# Patient Record
Sex: Male | Born: 1960 | Hispanic: No | Marital: Married | Smoking: Never smoker
Health system: Southern US, Community
[De-identification: ages and names within clinical notes are randomized; demographics above are authoritative.]

## PROBLEM LIST (undated history)

## (undated) DIAGNOSIS — K635 Polyp of colon: Secondary | ICD-10-CM

## (undated) DIAGNOSIS — E669 Obesity, unspecified: Secondary | ICD-10-CM

## (undated) DIAGNOSIS — M5030 Other cervical disc degeneration, unspecified cervical region: Secondary | ICD-10-CM

## (undated) DIAGNOSIS — D126 Benign neoplasm of colon, unspecified: Secondary | ICD-10-CM

## (undated) DIAGNOSIS — L309 Dermatitis, unspecified: Secondary | ICD-10-CM

## (undated) DIAGNOSIS — K5792 Diverticulitis of intestine, part unspecified, without perforation or abscess without bleeding: Secondary | ICD-10-CM

## (undated) DIAGNOSIS — E785 Hyperlipidemia, unspecified: Secondary | ICD-10-CM

## (undated) DIAGNOSIS — I10 Essential (primary) hypertension: Secondary | ICD-10-CM

## (undated) DIAGNOSIS — I499 Cardiac arrhythmia, unspecified: Secondary | ICD-10-CM

## (undated) DIAGNOSIS — M47816 Spondylosis without myelopathy or radiculopathy, lumbar region: Secondary | ICD-10-CM

## (undated) DIAGNOSIS — R030 Elevated blood-pressure reading, without diagnosis of hypertension: Secondary | ICD-10-CM

## (undated) DIAGNOSIS — IMO0001 Reserved for inherently not codable concepts without codable children: Secondary | ICD-10-CM

## (undated) DIAGNOSIS — G4730 Sleep apnea, unspecified: Secondary | ICD-10-CM

## (undated) HISTORY — PX: FINGER SURGERY: SHX640

## (undated) HISTORY — DX: Polyp of colon: K63.5

## (undated) HISTORY — DX: Hyperlipidemia, unspecified: E78.5

## (undated) HISTORY — DX: Benign neoplasm of colon, unspecified: D12.6

## (undated) HISTORY — DX: Cardiac arrhythmia, unspecified: I49.9

## (undated) HISTORY — DX: Other cervical disc degeneration, unspecified cervical region: M50.30

## (undated) HISTORY — DX: Essential (primary) hypertension: I10

## (undated) HISTORY — DX: Reserved for inherently not codable concepts without codable children: IMO0001

## (undated) HISTORY — DX: Diverticulitis of intestine, part unspecified, without perforation or abscess without bleeding: K57.92

## (undated) HISTORY — PX: VASECTOMY: SHX75

## (undated) HISTORY — DX: Dermatitis, unspecified: L30.9

## (undated) HISTORY — PX: COLONOSCOPY: SHX174

## (undated) HISTORY — DX: Sleep apnea, unspecified: G47.30

## (undated) HISTORY — DX: Elevated blood-pressure reading, without diagnosis of hypertension: R03.0

## (undated) HISTORY — DX: Spondylosis without myelopathy or radiculopathy, lumbar region: M47.816

## (undated) HISTORY — DX: Obesity, unspecified: E66.9

---

## 1998-01-28 ENCOUNTER — Other Ambulatory Visit: Admission: RE | Admit: 1998-01-28 | Discharge: 1998-01-28 | Payer: Self-pay | Admitting: Urology

## 2001-04-04 ENCOUNTER — Encounter: Admission: RE | Admit: 2001-04-04 | Discharge: 2001-04-04 | Payer: Self-pay | Admitting: Family Medicine

## 2001-04-04 ENCOUNTER — Encounter: Payer: Self-pay | Admitting: Family Medicine

## 2007-10-07 ENCOUNTER — Encounter: Admission: RE | Admit: 2007-10-07 | Discharge: 2007-10-07 | Payer: Self-pay | Admitting: Emergency Medicine

## 2009-11-04 ENCOUNTER — Encounter: Admission: RE | Admit: 2009-11-04 | Discharge: 2009-11-04 | Payer: Self-pay | Admitting: Chiropractic Medicine

## 2011-08-21 ENCOUNTER — Encounter: Payer: Self-pay | Admitting: Gastroenterology

## 2011-12-07 ENCOUNTER — Ambulatory Visit
Admission: RE | Admit: 2011-12-07 | Discharge: 2011-12-07 | Disposition: A | Discharge status: Home / Self Care | Payer: BC Managed Care – PPO | Source: Ambulatory Visit | Attending: Emergency Medicine | Admitting: Emergency Medicine

## 2011-12-07 ENCOUNTER — Other Ambulatory Visit: Payer: Self-pay | Admitting: Emergency Medicine

## 2011-12-07 DIAGNOSIS — R05 Cough: Secondary | ICD-10-CM

## 2013-12-03 ENCOUNTER — Encounter: Payer: Self-pay | Admitting: Gastroenterology

## 2013-12-29 ENCOUNTER — Ambulatory Visit: Payer: BC Managed Care – PPO | Admitting: *Deleted

## 2013-12-29 VITALS — Ht 73.0 in | Wt 294.2 lb

## 2013-12-29 DIAGNOSIS — Z1211 Encounter for screening for malignant neoplasm of colon: Secondary | ICD-10-CM

## 2013-12-29 MED ORDER — MOVIPREP 100 G PO SOLR: 1.0000 | Freq: Once | ORAL | Status: DC

## 2013-12-29 NOTE — Progress Notes (Signed)
No egg or soy allergy. ewm No home 02 use. ewm No diet pills. ewm No problems with past sedation. ewm emmi to pt's e mail. ewm

## 2014-01-05 ENCOUNTER — Encounter: Payer: Self-pay | Admitting: Gastroenterology

## 2014-01-29 ENCOUNTER — Ambulatory Visit (AMBULATORY_SURGERY_CENTER): Payer: BC Managed Care – PPO | Admitting: Gastroenterology

## 2014-01-29 ENCOUNTER — Encounter: Payer: Self-pay | Admitting: Gastroenterology

## 2014-01-29 VITALS — BP 153/102 | HR 80 | Temp 96.8°F | Resp 22 | Ht 73.0 in | Wt 294.0 lb

## 2014-01-29 DIAGNOSIS — D123 Benign neoplasm of transverse colon: Secondary | ICD-10-CM

## 2014-01-29 DIAGNOSIS — Z1211 Encounter for screening for malignant neoplasm of colon: Secondary | ICD-10-CM

## 2014-01-29 MED ORDER — SODIUM CHLORIDE 0.9 % IV SOLN: 500.0000 mL | INTRAVENOUS | Status: DC

## 2014-01-29 NOTE — Progress Notes (Signed)
Room staff made aware of elevated blood  And pt. Has not taken blood pressure medication in two days.

## 2014-01-29 NOTE — Progress Notes (Signed)
Called to room to assist during endoscopic procedure.  Patient ID and intended procedure confirmed with present staff. Received instructions for my participation in the procedure from the performing physician.  

## 2014-01-29 NOTE — Patient Instructions (Addendum)
YOU HAD AN ENDOSCOPIC PROCEDURE TODAY AT THE [facility]: Refer to the procedure report that was given to you for any specific questions about what was found during the examination.  If the procedure report does not answer your questions, please call your gastroenterologist to clarify.  If you requested that your care partner not be given the details of your procedure findings, then the procedure report has been included in a sealed envelope for you to review at your convenience later.  YOU SHOULD EXPECT: Some feelings of bloating in the abdomen. Passage of more gas than usual.  Walking can help get rid of the air that was put into your GI tract during the procedure and reduce the bloating. If you had a lower endoscopy (such as a colonoscopy or flexible sigmoidoscopy) you may notice spotting of blood in your stool or on the toilet paper. If you underwent a bowel prep for your procedure, then you may not have a normal bowel movement for a few days.  DIET: Your first meal following the procedure should be a light meal and then it is ok to progress to your normal diet.  A half-sandwich or bowl of soup is an example of a good first meal.  Heavy or fried foods are harder to digest and may make you feel nauseous or bloated.  Likewise meals heavy in dairy and vegetables can cause extra gas to form and this can also increase the bloating.  Drink plenty of fluids but you should avoid alcoholic beverages for 24 hours.  ACTIVITY: Your care partner should take you home directly after the procedure.  You should plan to take it easy, moving slowly for the rest of the day.  You can resume normal activity the day after the procedure however you should NOT DRIVE or use heavy machinery for 24 hours (because of the sedation medicines used during the test).    SYMPTOMS TO REPORT IMMEDIATELY: A gastroenterologist can be reached at any hour.  During normal business hours, 8:30 AM to 5:00 PM Monday through Friday,  call [phone].  After hours and on weekends, please call the GI answering service at [phone] who will take a message and have the physician on call contact you.   Following lower endoscopy (colonoscopy or flexible sigmoidoscopy):  Excessive amounts of blood in the stool  Significant tenderness or worsening of abdominal pains  Swelling of the abdomen that is new, acute  Fever of 100F or higher  FOLLOW UP: If any biopsies were taken you will be contacted by phone or by letter within the next 1-3 weeks.  Call your gastroenterologist if you have not heard about the biopsies in 3 weeks.  Our staff will call the home number listed on your records the next business day following your procedure to check on you and address any questions or concerns that you may have at that time regarding the information given to you following your procedure. This is a courtesy call and so if there is no answer at the home number and we have not heard from you through the emergency physician on call, we will assume that you have returned to your regular daily activities without incident.  SIGNATURES/CONFIDENTIALITY: You and/or your care partner have signed paperwork which will be entered into your electronic medical record.  These signatures attest to the fact that that the information above on your After Visit Summary has been reviewed and is understood.  Full responsibility of the confidentiality of this   discharge information lies with you and/or your care-partner.  Recommendations Next colonoscopy determined by pathology results; 5 years or 10 years. Polyp(s), diverticulosis, high fiber diet, and hemorrhoid handouts provided to patient/care partner. Hold all aspirin, aspirin products, and NSAIDS for 2 weeks.

## 2014-01-29 NOTE — Op Note (Signed)
[facility]  [address] [place], [address]   COLONOSCOPY PROCEDURE REPORT  PATIENT: Mitchell Sosa, Mitchell Sosa  MR#: [id number] BIRTHDATE: September 23, 1960 , 53  yrs. old GENDER: male ENDOSCOPIST: Ladene Artist, MD, [facility] REFERRED NI:OEVOJ, Robert PROCEDURE DATE:  01/29/2014 PROCEDURE:   Colonoscopy with snare polypectomy First Screening Colonoscopy - Avg.  risk and is 50 yrs.  old or older Yes.  Prior Negative Screening - Now for repeat screening. N/A  History of Adenoma - Now for follow-up colonoscopy & has been > or = to 3 yrs.  N/A  Polyps Removed Today? Yes. ASA CLASS:   Class II INDICATIONS:average risk for colorectal cancer. MEDICATIONS: Monitored anesthesia care and Propofol 400 mg IV DESCRIPTION OF PROCEDURE:   After the risks benefits and alternatives of the procedure were thoroughly explained, informed consent was obtained.  The digital rectal exam revealed no abnormalities of the rectum.   The LB [id number]  endoscope was introduced through the anus and advanced to the cecum, which was identified by both the appendix and ileocecal valve. No adverse events experienced.   The quality of the prep was excellent, using MoviPrep  The instrument was then slowly withdrawn as the colon was fully examined.  COLON FINDINGS: A sessile polyp measuring 10 mm in size was found at the hepatic flexure.  A polypectomy was performed using snare cautery.  The resection was complete, the polyp tissue was completely retrieved and sent to histology.   There was diverticulosis noted in the sigmoid colon.   The examination was otherwise normal.  Retroflexed views revealed internal Grade I hemorrhoids. The time to cecum=3 minutes 38 seconds.  Withdrawal time=14 minutes 31 seconds.  The scope was withdrawn and the procedure completed. COMPLICATIONS: There were no immediate complications.  ENDOSCOPIC IMPRESSION: 1.   Sessile polyp at the hepatic flexure; polypectomy  performed using snare cautery 2.   Diverticulosis was noted in the sigmoid colon 3.   Grade l internal hemorrhoids  RECOMMENDATIONS: 1.  Await pathology results 2.  Repeat colonoscopy in 5 years if polyp adenomatous; otherwise 10 years 3.  High fiber diet with liberal fluid intake.  eSigned:  Ladene Artist, MD, [facility] 01/29/2014 2:33 PM

## 2014-01-29 NOTE — Progress Notes (Signed)
Patient awakening,vss,report to rn 

## 2014-02-01 ENCOUNTER — Telehealth: Payer: Self-pay | Admitting: *Deleted

## 2014-02-01 NOTE — Telephone Encounter (Signed)
  Follow up Call-  Call back number 01/29/2014  Post procedure Call Back phone  # [phone]  Permission to leave phone message Yes    [facility]

## 2014-02-02 ENCOUNTER — Encounter: Payer: BC Managed Care – PPO | Admitting: Gastroenterology

## 2014-02-02 ENCOUNTER — Encounter: Payer: Self-pay | Admitting: Gastroenterology

## 2016-01-03 ENCOUNTER — Other Ambulatory Visit (HOSPITAL_COMMUNITY): Payer: Self-pay | Admitting: Family Medicine

## 2016-01-03 DIAGNOSIS — M25562 Pain in left knee: Secondary | ICD-10-CM

## 2016-01-05 ENCOUNTER — Other Ambulatory Visit (HOSPITAL_COMMUNITY): Payer: Self-pay | Admitting: Family Medicine

## 2016-01-05 DIAGNOSIS — M25562 Pain in left knee: Secondary | ICD-10-CM

## 2016-01-17 ENCOUNTER — Ambulatory Visit
Admission: RE | Admit: 2016-01-17 | Discharge: 2016-01-17 | Disposition: A | Discharge status: Home / Self Care | Payer: BLUE CROSS/BLUE SHIELD | Source: Ambulatory Visit | Attending: Family Medicine | Admitting: Family Medicine

## 2016-01-17 DIAGNOSIS — M25562 Pain in left knee: Secondary | ICD-10-CM | POA: Diagnosis not present

## 2016-01-30 ENCOUNTER — Ambulatory Visit (INDEPENDENT_AMBULATORY_CARE_PROVIDER_SITE_OTHER): Payer: BLUE CROSS/BLUE SHIELD | Admitting: Orthopaedic Surgery

## 2016-01-30 DIAGNOSIS — M93262 Osteochondritis dissecans, left knee: Secondary | ICD-10-CM | POA: Diagnosis not present

## 2016-01-30 DIAGNOSIS — M25562 Pain in left knee: Secondary | ICD-10-CM

## 2016-01-30 NOTE — Progress Notes (Signed)
Office Visit Note   Patient: Mitchell Sosa           Date of Birth: 1960-11-09           MRN: [id number] Visit Date: 01/30/2016              Requested by: Josetta Huddle, MD [address] [place], [place] [address] PCP: Henrine Screws, MD   Assessment & Plan: Visit Diagnoses:  1. 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 He has significant edema in the bone and joint effusion. This is from an acute injury. I showed him his MRI and with the knee modeling spent in detail what surgery involves including the risk and benefits of this. Given the amount of pain and swelling is having and the fact this is had on his mobility is activity is daily living he does wish to even arthroscopic intervention. We would then see him back in one week postoperative for suture removal.  Follow-Up Instructions: No Follow-up on file.   Orders:  No orders of the defined types were placed in this encounter.  No orders of the defined types were placed in this encounter.     Procedures: No procedures performed   Clinical Data: No additional findings.   Subjective: Chief Complaint  Patient presents with  . Left Knee - Pain    Patient has had some knee pain for a couple months. No new actual injury [place]". Patient states he did injure his knee as a teen in football. Had MRI recently from his PCP    HPI He complains of pain with weightbearing and a lot of locking catching in his knee as well as clicking. He is also had intermittent swelling of the left knee and it swells quite a bit toward the end of the day. His pain can be 1010 with activities. Review of Systems Negative for headache, chest pain, shortness breath, fever, chills, nausea, vomiting.  Objective: Vital Signs: There were no vitals taken for this  visit.  Physical Exam He is alert and oriented 3 in no acute distress robs discomfort Ortho Exam Examination of his left knee shows a mild effusion. He has significant crepitation of patellofemoral joint that he does not have on the right side. He has lateral joint line tenderness as well but most of his pain was with manipulating the patella at the trochlear groove. Specialty Comments:  No specialty comments available.  Imaging: No results found. I reviewed his MRI with him in detail 1 over a knee model showing him where the osteochondral lesion is at the trochlar groove. There significant surrounding edema in the lateral femoral condyle and around the trochlear groove to the lateral side where this OCD lesion is   PMstory: There are no active problems to display for this patient.  Past Medical History:  Diagnosis Date  . Degenerative disc disease, cervical    Cervical degenerative disease with radiculopathy  . Elevated blood pressure    without diagnosis of hypertension  . Hyperlipidemia   . Hypertension   . Lumbar spondylosis    multilevel lumbar spondylosis, and probably L2-3 spinal stenosis - Dr. Hazle Coca  . Obesity   . Sleep apnea    has cpap-doesnt use it much    Family History  Problem Relation Age of Onset  .  Dementia Mother   . Heart disease Father   . Stroke Father   . Colon cancer Neg Hx   . Rectal cancer Neg Hx   . Stomach cancer Neg Hx     Past Surgical History:  Procedure Laterality Date  . FINGER SURGERY     right fifth finger secondary to football injury   . VASECTOMY     Social History   Occupational History  . Not on file.   Social History Main Topics  . Smoking status: Never Smoker  . Smokeless tobacco: Never Used  . Alcohol use 0.0 oz/week     Comment: occasionally-rare  . Drug use: No  . Sexual activity: Not on file

## 2016-02-14 DIAGNOSIS — M93262 Osteochondritis dissecans, left knee: Secondary | ICD-10-CM | POA: Diagnosis not present

## 2016-02-15 DIAGNOSIS — M1712 Unilateral primary osteoarthritis, left knee: Secondary | ICD-10-CM | POA: Diagnosis not present

## 2016-02-15 DIAGNOSIS — M25562 Pain in left knee: Secondary | ICD-10-CM | POA: Diagnosis not present

## 2016-02-16 ENCOUNTER — Inpatient Hospital Stay (INDEPENDENT_AMBULATORY_CARE_PROVIDER_SITE_OTHER): Payer: Self-pay | Admitting: Physician Assistant

## 2016-06-01 DIAGNOSIS — E782 Mixed hyperlipidemia: Secondary | ICD-10-CM | POA: Diagnosis not present

## 2016-06-01 DIAGNOSIS — L820 Inflamed seborrheic keratosis: Secondary | ICD-10-CM | POA: Diagnosis not present

## 2016-06-01 DIAGNOSIS — E669 Obesity, unspecified: Secondary | ICD-10-CM | POA: Diagnosis not present

## 2016-06-01 DIAGNOSIS — E559 Vitamin D deficiency, unspecified: Secondary | ICD-10-CM | POA: Diagnosis not present

## 2016-06-01 DIAGNOSIS — I10 Essential (primary) hypertension: Secondary | ICD-10-CM | POA: Diagnosis not present

## 2016-06-06 DIAGNOSIS — Z205 Contact with and (suspected) exposure to viral hepatitis: Secondary | ICD-10-CM | POA: Diagnosis not present

## 2017-10-29 DIAGNOSIS — H35033 Hypertensive retinopathy, bilateral: Secondary | ICD-10-CM | POA: Diagnosis not present

## 2017-11-10 DIAGNOSIS — S51812A Laceration without foreign body of left forearm, initial encounter: Secondary | ICD-10-CM | POA: Diagnosis not present

## 2017-11-10 DIAGNOSIS — W1789XA Other fall from one level to another, initial encounter: Secondary | ICD-10-CM | POA: Diagnosis not present

## 2017-11-10 DIAGNOSIS — S4992XA Unspecified injury of left shoulder and upper arm, initial encounter: Secondary | ICD-10-CM | POA: Diagnosis not present

## 2018-03-10 ENCOUNTER — Other Ambulatory Visit (HOSPITAL_BASED_OUTPATIENT_CLINIC_OR_DEPARTMENT_OTHER): Payer: Self-pay

## 2018-03-10 DIAGNOSIS — R5383 Other fatigue: Secondary | ICD-10-CM

## 2018-03-10 DIAGNOSIS — R0683 Snoring: Secondary | ICD-10-CM

## 2018-03-10 DIAGNOSIS — G4730 Sleep apnea, unspecified: Secondary | ICD-10-CM

## 2018-03-10 DIAGNOSIS — G4710 Hypersomnia, unspecified: Secondary | ICD-10-CM

## 2018-03-17 ENCOUNTER — Ambulatory Visit (HOSPITAL_BASED_OUTPATIENT_CLINIC_OR_DEPARTMENT_OTHER): Payer: BLUE CROSS/BLUE SHIELD | Attending: Family Medicine | Admitting: Internal Medicine

## 2018-03-17 VITALS — Ht 73.0 in | Wt 300.0 lb

## 2018-03-17 DIAGNOSIS — G4730 Sleep apnea, unspecified: Secondary | ICD-10-CM | POA: Diagnosis not present

## 2018-03-17 DIAGNOSIS — G4752 REM sleep behavior disorder: Secondary | ICD-10-CM | POA: Insufficient documentation

## 2018-03-17 DIAGNOSIS — R5383 Other fatigue: Secondary | ICD-10-CM | POA: Diagnosis not present

## 2018-03-17 DIAGNOSIS — G4710 Hypersomnia, unspecified: Secondary | ICD-10-CM | POA: Diagnosis not present

## 2018-03-17 DIAGNOSIS — R0683 Snoring: Secondary | ICD-10-CM | POA: Diagnosis not present

## 2018-03-17 DIAGNOSIS — R0681 Apnea, not elsewhere classified: Secondary | ICD-10-CM

## 2018-03-22 DIAGNOSIS — R0683 Snoring: Secondary | ICD-10-CM | POA: Diagnosis not present

## 2018-03-22 NOTE — Procedures (Signed)
Patient Name: Mitchell, Sosa Date: 03/17/2018 Gender: Male D.O.B: 1960-08-03 Age (years): 57 Referring Provider: Waldemar Dickens Height (inches): 48 Interpreting Physician: Baird Lyons MD, ABS[facility] Weight (lbs): 300 RPSGT: Laren Everts BMI: 40 MRN: [id number] Neck Size: 20.00  CLINICAL INFORMATION Sleep Study Type: Split Night CPAP Indication for sleep study: Excessive Daytime Sleepiness, Fatigue, Hypertension, Obesity, Snoring, Witnessed Apneas Epworth Sleepiness Score: 5  SLEEP STUDY TECHNIQUE As per the AASM Manual for the Scoring of Sleep and Associated Events v2.3 (April 2016) with a hypopnea requiring 4% desaturations.  The channels recorded and monitored were frontal, central and occipital EEG, electrooculogram (EOG), submentalis EMG (chin), nasal and oral airflow, thoracic and abdominal wall motion, anterior tibialis EMG, snore microphone, electrocardiogram, and pulse oximetry. Continuous positive airway pressure (CPAP) was initiated when the patient met split night criteria and was titrated according to treat sleep-disordered breathing.  MEDICATIONS Medications self-administered by patient taken the night of the study : AMBIEN  RESPIRATORY PARAMETERS Diagnostic  Total AHI (/hr): 113.8 RDI (/hr): 115.6 OA Index (/hr): 30.4 CA Index (/hr): 0.0 REM AHI (/hr): 97.5 NREM AHI (/hr): 114.8 Supine AHI (/hr): 113.8 Non-supine AHI (/hr): 0 Min O2 Sat (%): 65.0 Mean O2 (%): 87.9 Time below 88% (min): 65.2   Titration  Optimal Pressure (cm): 17 AHI at Optimal Pressure (/hr): 0.0 Min O2 at Optimal Pressure (%): 94.0 Supine % at Optimal (%): 100 Sleep % at Optimal (%): 100   SLEEP ARCHITECTURE The recording time for the entire night was 412.6 minutes.  During a baseline period of 158.1 minutes, the patient slept for 136.0 minutes in REM and nonREM, yielding a sleep efficiency of 86.0%%. Sleep onset after lights out was 15.6 minutes with a REM latency of 74.0  minutes. The patient spent 7.4%% of the night in stage N1 sleep, 86.8%% in stage N2 sleep, 0.0%% in stage N3 and 5.9% in REM.  During the titration period of 246.0 minutes, the patient slept for 229.0 minutes in REM and nonREM, yielding a sleep efficiency of 93.1%%. Sleep onset after CPAP initiation was 3.9 minutes with a REM latency of 26.5 minutes. The patient spent 7.9%% of the night in stage N1 sleep, 63.5%% in stage N2 sleep, 0.0%% in stage N3 and 28.6% in REM.  CARDIAC DATA The 2 lead EKG demonstrated sinus rhythm. The mean heart rate was 100.0 beats per minute. Other EKG findings include: PVCs.  LEG MOVEMENT DATA The total Periodic Limb Movements of Sleep (PLMS) were 0. The PLMS index was 0.0 .  IMPRESSIONS - Severe obstructive sleep apnea occurred during the diagnostic portion of the study (AHI = 113.8/hour). An optimal PAP pressure was selected for this patient ( 17 cm of water) - No significant central sleep apnea occurred during the diagnostic portion of the study (CAI = 0.0/hour). - Moderate oxygen desaturation was noted during the diagnostic portion of the study (Min O2 =65.0%). Min sat at CPAP 17 was 94%. - The patient snored with loud snoring volume during the diagnostic portion of the study. - EKG findings include PVCs. - Clinically significant periodic limb movements did not occur during sleep.  DIAGNOSIS - Obstructive Sleep Apnea (327.23 [G47.33 ICD-10])  RECOMMENDATIONS - Trial of CPAP therapy on 17 cm H2O, Or autopap 10-20. - Patient used a Large size Philips Respironics Full Face Mask Dreamwear mask and heated humidification. - Be careful with alcohol, sedatives and other CNS depressants that may worsen sleep apnea and disrupt normal sleep architecture. -  Sleep hygiene should be reviewed to assess factors that may improve sleep quality. - Weight management and regular exercise should be initiated or continued.  [Electronically signed] 03/22/2018 03:05 PM  Baird Lyons MD, [place], American Board of Sleep Medicine   NPI: [id number]                      [place], [place] [facility]

## 2018-03-28 IMAGING — MR MR KNEE*L* W/O CM
4 of 5 series · 31 of 40 positions shown · non-contrast
Comparison: None.

CLINICAL DATA: Left knee pain and swelling. Increased pain with
flexing.

EXAM:
MRI OF THE LEFT KNEE WITHOUT CONTRAST
TECHNIQUE: Multiplanar, multisequence MR imaging of the knee was performed. No
intravenous contrast was administered.

[Series 6: PD fat-sat · axial · left · 3.0mm · 0.44mm/px · z∈[-38,+87]mm · 9 of 39 slices shown (1 of 3)]
[im 1/39]
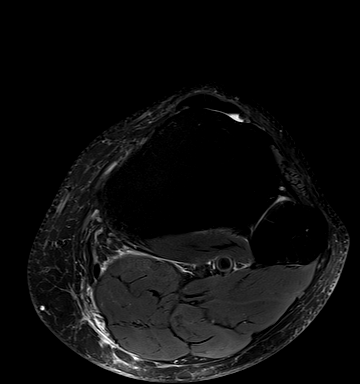
[im 5/39]
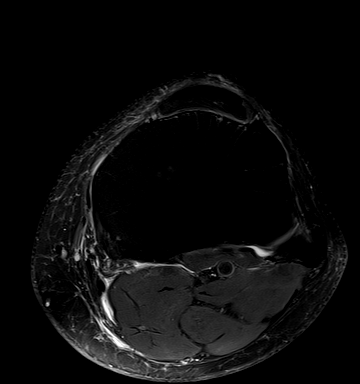
[im 10/39]
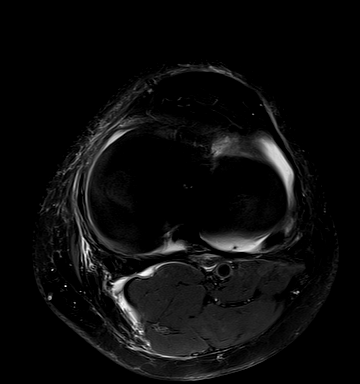
[im 15/39]
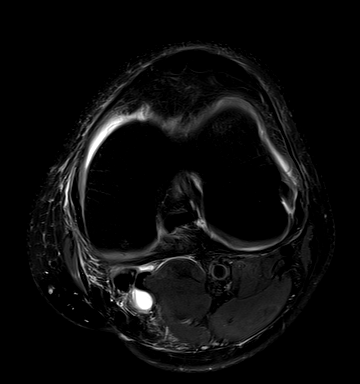
[im 20/39]
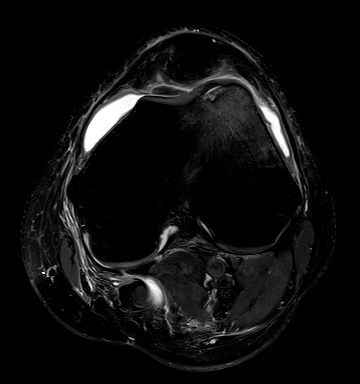
[im 24/39]
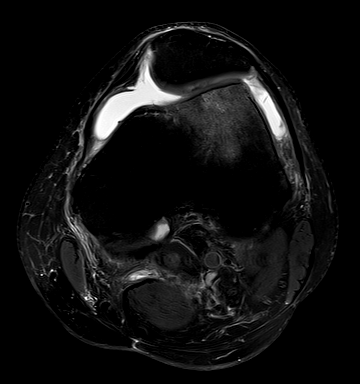
[im 29/39]
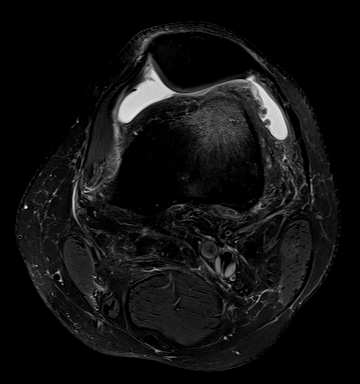
[im 34/39]
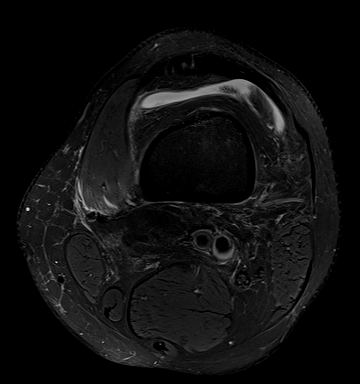
[im 39/39]
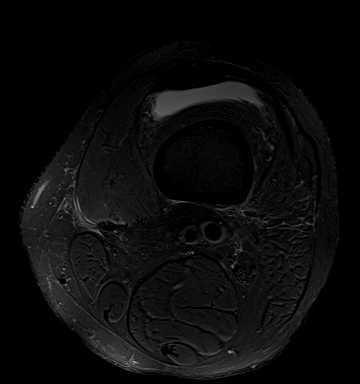

[Series 8: PD fat-sat · coronal · left · 3.0mm · 0.33mm/px · 8 of 39 slices shown (2 of 3)]
[im 1/39]
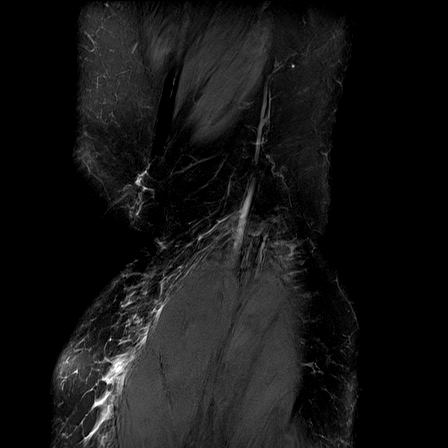
[im 6/39]
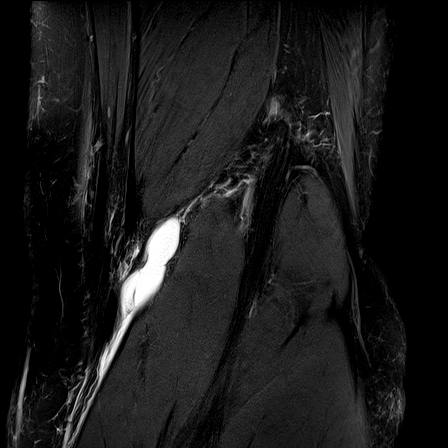
[im 11/39]
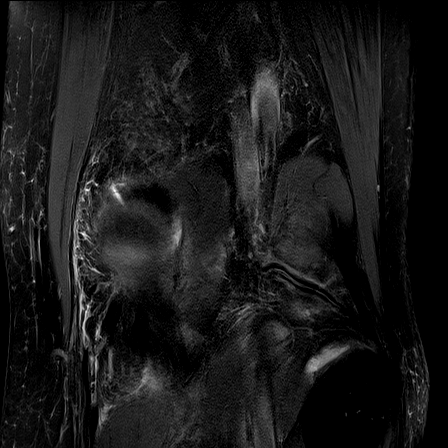
[im 17/39]
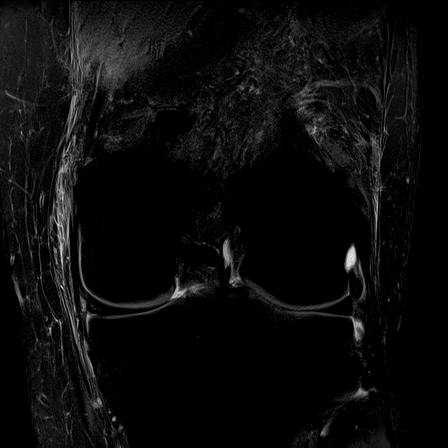
[im 22/39]
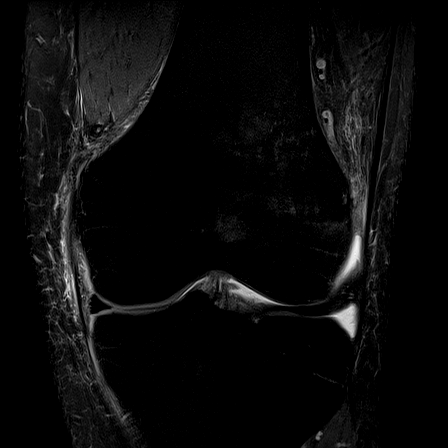
[im 28/39]
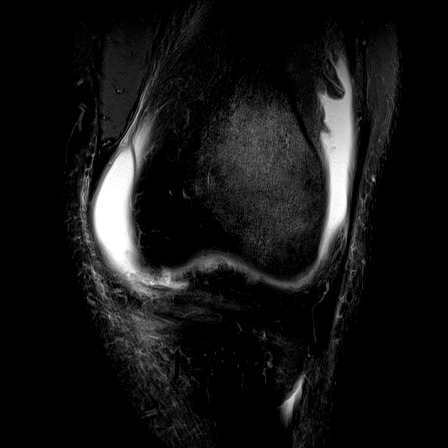
[im 33/39]
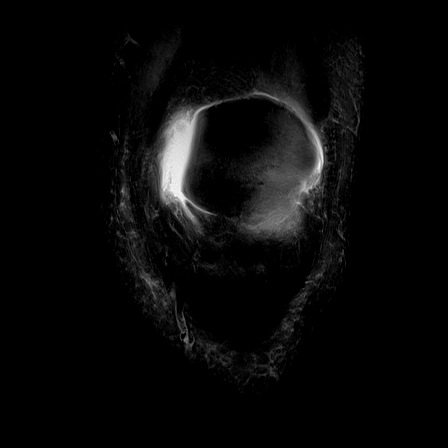
[im 39/39]
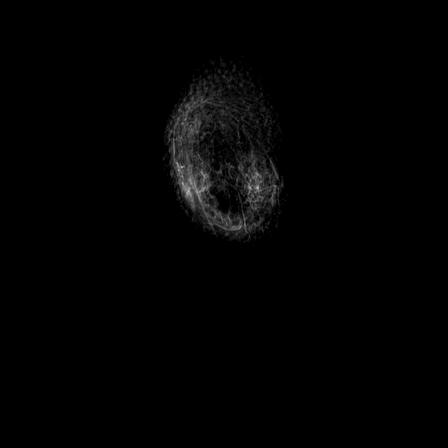

[Series 9: PD fat-sat · sagittal · left · 3.0mm · 0.39mm/px · 7 of 33 slices shown (3 of 3)]
[im 1/33]
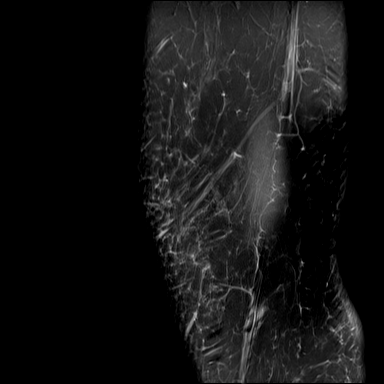
[im 6/33]
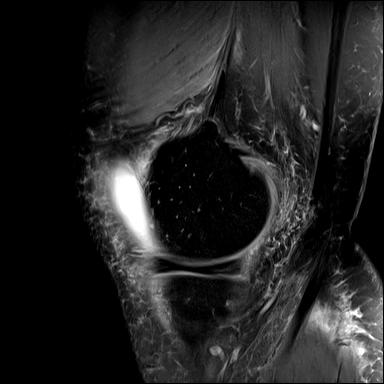
[im 11/33]
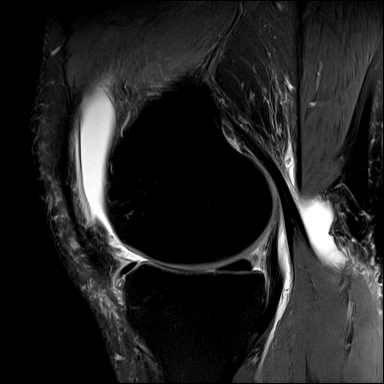
[im 17/33]
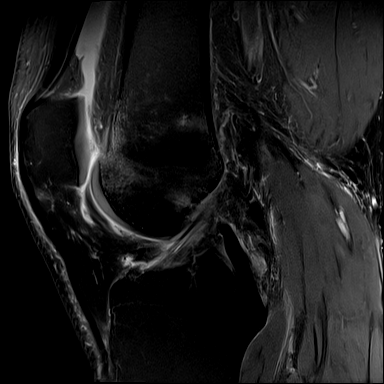
[im 22/33]
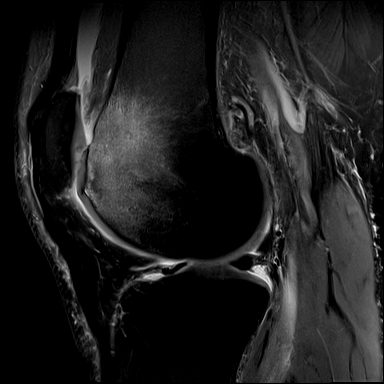
[im 27/33]
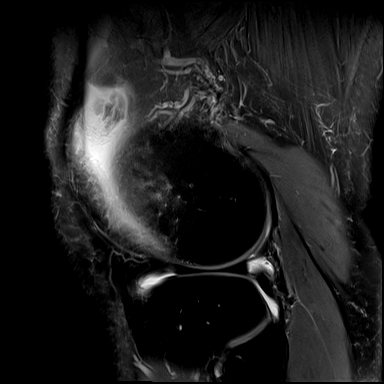
[im 33/33]
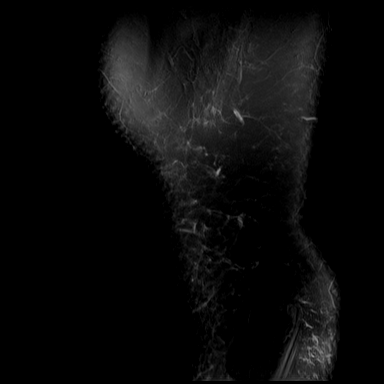

[Series 10: T2 fat-sat · coronal · left · 3.0mm · 0.39mm/px · 7 of 39 slices shown]
[im 1/39]
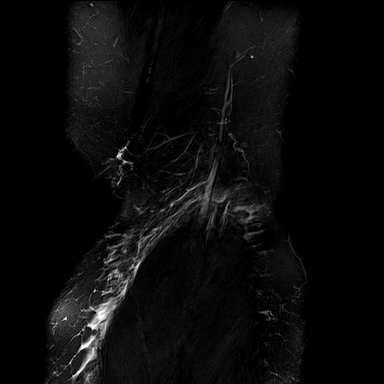
[im 6/39]
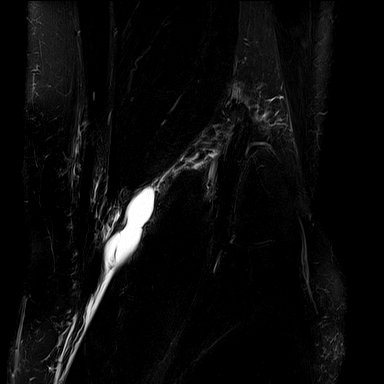
[im 11/39]
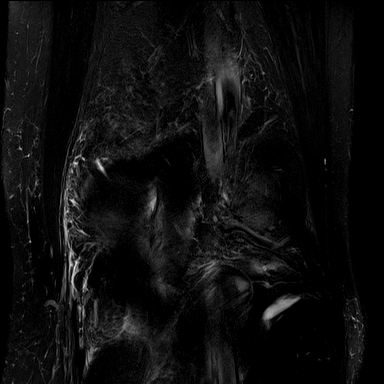
[im 17/39]
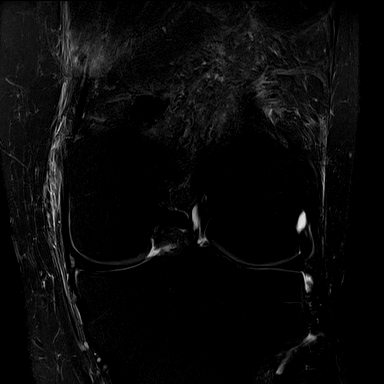
[im 22/39]
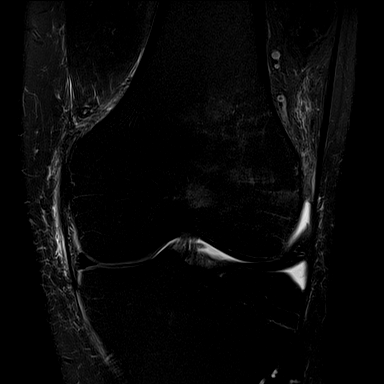
[im 28/39]
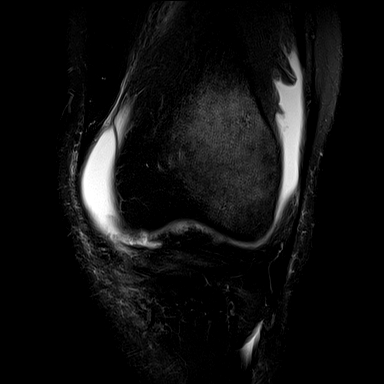
[im 33/39]
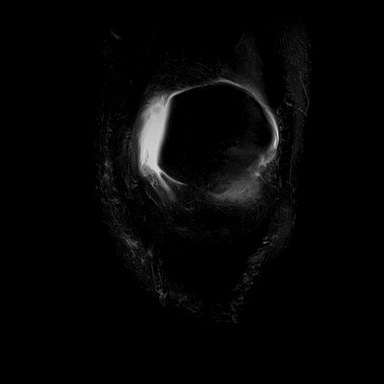

[31 of 40 positions shown; findings below may reference images not displayed]

FINDINGS: MENISCI

Medial meniscus:  Intact.

Lateral meniscus:  Intact.

LIGAMENTS

Cruciates:  Intact ACL and PCL.

Collaterals: Mild edema superficial to the MCL with the MCL
otherwise intact most consistent with grade 1 MCL injury. Lateral
collateral ligament complex is intact.

CARTILAGE

Patellofemoral: Mild partial-thickness cartilage loss of the lateral
patellofemoral compartment. Osteochondral lesion involving the
lateral trochlea measuring 11 x 17 mm with subcortical cystic
changes and a small area of fluid undercutting the cartilage without
overlying cartilage loss. Severe marrow edema in the bone deep to
the chondral surface.

Medial:  No chondral defect.

Lateral:  No chondral defect.

Joint: Large joint effusion. Normal Hoffa's fat. No plical
thickening.

Popliteal Fossa: Small Baker cyst. Small amount of fluid superficial
to the medial gastrocnemius muscle most consistent with a leaking
Baker cyst. Intact popliteus tendon.

Extensor Mechanism: Intact quadriceps tendon and patellar tendon.
Intact lateral patellar retinaculum. Mild increased signal in the
medial patellar retinaculum likely reflecting injury without
disruption.

Bones:  No evidence of fracture or dislocation.

Other: No fluid collection or hematoma.
IMPRESSION: 1. Osteochondral lesion involving the lateral trochlea measuring 11
x 17 mm with subcortical cystic changes and a small area of fluid
undercutting the cartilage without overlying cartilage loss. Severe
marrow edema in the bone deep to the chondral surface. Alternatively
this may reflect an osteochondral impaction injury secondary to
direct trauma versus a remote transient lateral patellar dislocation
(Although no associated marrow contusion is identified in the medial
patella).
2. Mild partial-thickness cartilage loss of the lateral
patellofemoral compartment.
3. Grade 1 MCL strain.
4. Large joint effusion.
5. Small leaking Baker cyst.

## 2018-04-18 DIAGNOSIS — G4733 Obstructive sleep apnea (adult) (pediatric): Secondary | ICD-10-CM | POA: Diagnosis not present

## 2018-05-17 DIAGNOSIS — G4733 Obstructive sleep apnea (adult) (pediatric): Secondary | ICD-10-CM | POA: Diagnosis not present

## 2018-06-17 DIAGNOSIS — G4733 Obstructive sleep apnea (adult) (pediatric): Secondary | ICD-10-CM | POA: Diagnosis not present

## 2018-07-17 DIAGNOSIS — G4733 Obstructive sleep apnea (adult) (pediatric): Secondary | ICD-10-CM | POA: Diagnosis not present

## 2018-07-23 DIAGNOSIS — E782 Mixed hyperlipidemia: Secondary | ICD-10-CM | POA: Diagnosis not present

## 2018-07-23 DIAGNOSIS — Z125 Encounter for screening for malignant neoplasm of prostate: Secondary | ICD-10-CM | POA: Diagnosis not present

## 2018-07-23 DIAGNOSIS — E559 Vitamin D deficiency, unspecified: Secondary | ICD-10-CM | POA: Diagnosis not present

## 2018-07-23 DIAGNOSIS — I10 Essential (primary) hypertension: Secondary | ICD-10-CM | POA: Diagnosis not present

## 2018-07-24 DIAGNOSIS — I10 Essential (primary) hypertension: Secondary | ICD-10-CM | POA: Diagnosis not present

## 2018-07-24 DIAGNOSIS — E782 Mixed hyperlipidemia: Secondary | ICD-10-CM | POA: Diagnosis not present

## 2018-07-24 DIAGNOSIS — Z79899 Other long term (current) drug therapy: Secondary | ICD-10-CM | POA: Diagnosis not present

## 2018-07-24 DIAGNOSIS — E559 Vitamin D deficiency, unspecified: Secondary | ICD-10-CM | POA: Diagnosis not present

## 2018-07-24 DIAGNOSIS — Z125 Encounter for screening for malignant neoplasm of prostate: Secondary | ICD-10-CM | POA: Diagnosis not present

## 2018-08-17 DIAGNOSIS — G4733 Obstructive sleep apnea (adult) (pediatric): Secondary | ICD-10-CM | POA: Diagnosis not present

## 2018-09-16 DIAGNOSIS — G4733 Obstructive sleep apnea (adult) (pediatric): Secondary | ICD-10-CM | POA: Diagnosis not present

## 2018-09-24 DIAGNOSIS — R809 Proteinuria, unspecified: Secondary | ICD-10-CM | POA: Diagnosis not present

## 2018-10-15 DIAGNOSIS — R809 Proteinuria, unspecified: Secondary | ICD-10-CM | POA: Diagnosis not present

## 2018-10-15 DIAGNOSIS — Z0000 Encounter for general adult medical examination without abnormal findings: Secondary | ICD-10-CM | POA: Diagnosis not present

## 2018-10-15 DIAGNOSIS — Z79899 Other long term (current) drug therapy: Secondary | ICD-10-CM | POA: Diagnosis not present

## 2018-10-15 DIAGNOSIS — I10 Essential (primary) hypertension: Secondary | ICD-10-CM | POA: Diagnosis not present

## 2018-10-17 DIAGNOSIS — G4733 Obstructive sleep apnea (adult) (pediatric): Secondary | ICD-10-CM | POA: Diagnosis not present

## 2018-10-29 DIAGNOSIS — Z9989 Dependence on other enabling machines and devices: Secondary | ICD-10-CM | POA: Diagnosis not present

## 2018-10-29 DIAGNOSIS — J343 Hypertrophy of nasal turbinates: Secondary | ICD-10-CM | POA: Diagnosis not present

## 2018-10-29 DIAGNOSIS — G4733 Obstructive sleep apnea (adult) (pediatric): Secondary | ICD-10-CM | POA: Diagnosis not present

## 2018-10-29 DIAGNOSIS — J342 Deviated nasal septum: Secondary | ICD-10-CM | POA: Diagnosis not present

## 2018-11-17 DIAGNOSIS — G4733 Obstructive sleep apnea (adult) (pediatric): Secondary | ICD-10-CM | POA: Diagnosis not present

## 2018-12-17 DIAGNOSIS — G4733 Obstructive sleep apnea (adult) (pediatric): Secondary | ICD-10-CM | POA: Diagnosis not present

## 2018-12-23 ENCOUNTER — Encounter: Payer: Self-pay | Admitting: Gastroenterology

## 2019-01-17 DIAGNOSIS — G4733 Obstructive sleep apnea (adult) (pediatric): Secondary | ICD-10-CM | POA: Diagnosis not present

## 2019-01-23 ENCOUNTER — Encounter: Payer: Self-pay | Admitting: Gastroenterology

## 2019-10-22 DIAGNOSIS — E559 Vitamin D deficiency, unspecified: Secondary | ICD-10-CM | POA: Diagnosis not present

## 2019-10-22 DIAGNOSIS — E782 Mixed hyperlipidemia: Secondary | ICD-10-CM | POA: Diagnosis not present

## 2019-10-22 DIAGNOSIS — H6121 Impacted cerumen, right ear: Secondary | ICD-10-CM | POA: Diagnosis not present

## 2019-10-22 DIAGNOSIS — Z125 Encounter for screening for malignant neoplasm of prostate: Secondary | ICD-10-CM | POA: Diagnosis not present

## 2019-10-22 DIAGNOSIS — N529 Male erectile dysfunction, unspecified: Secondary | ICD-10-CM | POA: Diagnosis not present

## 2019-10-22 DIAGNOSIS — I10 Essential (primary) hypertension: Secondary | ICD-10-CM | POA: Diagnosis not present

## 2019-10-22 DIAGNOSIS — Z0000 Encounter for general adult medical examination without abnormal findings: Secondary | ICD-10-CM | POA: Diagnosis not present

## 2019-10-22 DIAGNOSIS — Z79899 Other long term (current) drug therapy: Secondary | ICD-10-CM | POA: Diagnosis not present

## 2020-08-17 ENCOUNTER — Encounter: Payer: Self-pay | Admitting: Gastroenterology

## 2020-10-26 ENCOUNTER — Ambulatory Visit (AMBULATORY_SURGERY_CENTER): Payer: 59

## 2020-10-26 ENCOUNTER — Other Ambulatory Visit: Payer: Self-pay

## 2020-10-26 VITALS — Ht 73.0 in | Wt 300.0 lb

## 2020-10-26 DIAGNOSIS — Z86010 Personal history of colonic polyps: Secondary | ICD-10-CM

## 2020-10-26 MED ORDER — PEG 3350-KCL-NA BICARB-NACL 420 G PO SOLR: 4000.0000 mL | Freq: Once | ORAL | 0 refills | Status: AC

## 2020-10-26 NOTE — Progress Notes (Signed)
Patient's pre-visit was done today over the phone with the patient due to COVID-19 pandemic. Name,DOB and address verified. Insurance verified. Patient denies any allergies to Eggs and Soy. Patient denies any problems with anesthesia/sedation. Patient denies taking diet pills or blood thinners. No home Oxygen. Packet of Prep instructions mailed to patient including a copy of a consent form-pt is aware. Patient understands to call us back with any questions or concerns. Patient is aware of our care-partner policy and 0000000 safety protocol.     The patient is COVID-19 vaccinated.

## 2020-11-09 ENCOUNTER — Encounter: Payer: BLUE CROSS/BLUE SHIELD | Admitting: Gastroenterology

## 2021-01-05 ENCOUNTER — Encounter: Payer: 59 | Admitting: Gastroenterology

## 2021-02-15 ENCOUNTER — Ambulatory Visit (AMBULATORY_SURGERY_CENTER): Payer: 59 | Admitting: Gastroenterology

## 2021-02-15 ENCOUNTER — Other Ambulatory Visit: Payer: Self-pay

## 2021-02-15 ENCOUNTER — Encounter: Payer: Self-pay | Admitting: Gastroenterology

## 2021-02-15 VITALS — BP 127/79 | HR 65 | Temp 98.4°F | Resp 13 | Ht 73.0 in | Wt 300.0 lb

## 2021-02-15 DIAGNOSIS — D122 Benign neoplasm of ascending colon: Secondary | ICD-10-CM

## 2021-02-15 DIAGNOSIS — Z86010 Personal history of colonic polyps: Secondary | ICD-10-CM | POA: Diagnosis not present

## 2021-02-15 DIAGNOSIS — D123 Benign neoplasm of transverse colon: Secondary | ICD-10-CM

## 2021-02-15 DIAGNOSIS — D128 Benign neoplasm of rectum: Secondary | ICD-10-CM

## 2021-02-15 MED ORDER — SODIUM CHLORIDE 0.9 % IV SOLN: 500.0000 mL | Freq: Once | INTRAVENOUS | Status: AC

## 2021-02-15 NOTE — Patient Instructions (Signed)
Information on polyps, diverticulosis and hemorrhoids given to you today.  Await pathology results.  Resume previous diet and medications.  YOU HAD AN ENDOSCOPIC PROCEDURE TODAY AT THE [facility]:   Refer to the procedure report that was given to you for any specific questions about what was found during the examination.  If the procedure report does not answer your questions, please call your gastroenterologist to clarify.  If you requested that your care partner not be given the details of your procedure findings, then the procedure report has been included in a sealed envelope for you to review at your convenience later.  YOU SHOULD EXPECT: Some feelings of bloating in the abdomen. Passage of more gas than usual.  Walking can help get rid of the air that was put into your GI tract during the procedure and reduce the bloating. If you had a lower endoscopy (such as a colonoscopy or flexible sigmoidoscopy) you may notice spotting of blood in your stool or on the toilet paper. If you underwent a bowel prep for your procedure, you may not have a normal bowel movement for a few days.  Please Note:  You might notice some irritation and congestion in your nose or some drainage.  This is from the oxygen used during your procedure.  There is no need for concern and it should clear up in a day or so.  SYMPTOMS TO REPORT IMMEDIATELY:   Following lower endoscopy (colonoscopy or flexible sigmoidoscopy):  Excessive amounts of blood in the stool  Significant tenderness or worsening of abdominal pains  Swelling of the abdomen that is new, acute  Fever of 100F or higher   For urgent or emergent issues, a gastroenterologist can be reached at any hour by calling [phone]. Do not use MyChart messaging for urgent concerns.    DIET:  We do recommend a small meal at first, but then you may proceed to your regular diet.  Drink plenty of fluids but you should avoid alcoholic beverages for 24  hours.  ACTIVITY:  You should plan to take it easy for the rest of today and you should NOT DRIVE or use heavy machinery until tomorrow (because of the sedation medicines used during the test).    FOLLOW UP: Our staff will call the number listed on your records 48-72 hours following your procedure to check on you and address any questions or concerns that you may have regarding the information given to you following your procedure. If we do not reach you, we will leave a message.  We will attempt to reach you two times.  During this call, we will ask if you have developed any symptoms of COVID 19. If you develop any symptoms (ie: fever, flu-like symptoms, shortness of breath, cough etc.) before then, please call [phone].  If you test positive for Covid 19 in the 2 weeks post procedure, please call and report this information to us.    If any biopsies were taken you will be contacted by phone or by letter within the next 1-3 weeks.  Please call us at [phone] if you have not heard about the biopsies in 3 weeks.    SIGNATURES/CONFIDENTIALITY: You and/or your care partner have signed paperwork which will be entered into your electronic medical record.  These signatures attest to the fact that that the information above on your After Visit Summary has been reviewed and is understood.  Full responsibility of the confidentiality of this discharge information lies with you   and/or your care-partner. 

## 2021-02-15 NOTE — Progress Notes (Signed)
Report given to PACU, vss 

## 2021-02-15 NOTE — Progress Notes (Signed)
Patient states that he misunderstood the directions and ate a sandwich on 12/6 around 11:30. States he finished his prep and is liquid and clear now.

## 2021-02-15 NOTE — Op Note (Signed)
[place] Patient Name: Mitchell Sosa Procedure Date: 02/15/2021 11:33 AM MRN: [id number] Endoscopist: Ladene Artist , MD Age: 60 Referring MD:  Date of Birth: June 05, 1960 Gender: Male Account #: [id number] Procedure:                Colonoscopy Indications:              Surveillance: Personal history of adenomatous                            polyps on last colonoscopy > 5 years ago Medicines:                Monitored Anesthesia Care Procedure:                Pre-Anesthesia Assessment:                           - Prior to the procedure, a History and Physical                            was performed, and patient medications and                            allergies were reviewed. The patient's tolerance of                            previous anesthesia was also reviewed. The risks                            and benefits of the procedure and the sedation                            options and risks were discussed with the patient.                            All questions were answered, and informed consent                            was obtained. Prior Anticoagulants: The patient has                            taken no previous anticoagulant or antiplatelet                            agents. ASA Grade Assessment: II - A patient with                            mild systemic disease. After reviewing the risks                            and benefits, the patient was deemed in                            satisfactory condition to undergo the procedure.  After obtaining informed consent, the colonoscope                            was passed under direct vision. Throughout the                            procedure, the patient's blood pressure, pulse, and                            oxygen saturations were monitored continuously. The                            Colonoscope was introduced through the anus and                            advanced to the the  cecum, identified by                            appendiceal orifice and ileocecal valve. The                            ileocecal valve, appendiceal orifice, and rectum                            were photographed. The quality of the bowel                            preparation was good. The colonoscopy was performed                            without difficulty. The patient tolerated the                            procedure well. Scope In: 11:36:23 AM Scope Out: 11:57:48 AM Scope Withdrawal Time: 0 hours 16 minutes 27 seconds  Total Procedure Duration: 0 hours 21 minutes 25 seconds  Findings:                 The perianal and digital rectal examinations were                            normal.                           Five sessile polyps were found in the proximal                            rectum, transverse colon (3) and ascending colon.                            The polyps were 6 to 9 mm in size. These polyps                            were removed with a cold snare. Resection and  retrieval were complete.                           Multiple medium-mouthed diverticula were found in                            the left colon. There was evidence of diverticular                            spasm. There was no evidence of diverticular                            bleeding.                           Internal hemorrhoids were found during                            retroflexion. The hemorrhoids were small and Grade                            I (internal hemorrhoids that do not prolapse).                           The exam was otherwise without abnormality on                            direct and retroflexion views. Complications:            No immediate complications. Estimated blood loss:                            None. Estimated Blood Loss:     Estimated blood loss: none. Impression:               - Five 6 to 9 mm polyps in the proximal rectum, in                             the transverse colon and in the ascending colon,                            removed with a cold snare. Resected and retrieved.                           - Moderate diverticulosis in the left colon.                           - Internal hemorrhoids.                           - The examination was otherwise normal on direct                            and retroflexion views. Recommendation:           - Repeat colonoscopy after studies are complete for  surveillance based on pathology results.                           - Patient has a contact number available for                            emergencies. The signs and symptoms of potential                            delayed complications were discussed with the                            patient. Return to normal activities tomorrow.                            Written discharge instructions were provided to the                            patient.                           - High fiber diet.                           - Continue present medications.                           - Await pathology results. Ladene Artist, MD 02/15/2021 12:02:15 PM This report has been signed electronically.

## 2021-02-15 NOTE — Progress Notes (Signed)
Pt's states no medical or surgical changes since previsit or office visit. VS assessed by C.W 

## 2021-02-15 NOTE — Progress Notes (Signed)
History & Physical  Primary Care Physician:  Josetta Huddle, MD Primary Gastroenterologist: Lucio Edward, MD  CHIEF COMPLAINT:  Personal history of colon polyps   HPI: Mitchell Sosa is a 60 y.o. male with a history of adenomatous colon polyps for surveillance colonoscopy.    Past Medical History:  Diagnosis Date   Degenerative disc disease, cervical    Cervical degenerative disease with radiculopathy   Elevated blood pressure    without diagnosis of hypertension   Hyperlipidemia    Hypertension    Lumbar spondylosis    multilevel lumbar spondylosis, and probably L2-3 spinal stenosis - Dr. Hazle Coca   Obesity    Sleep apnea    has c-pap    Past Surgical History:  Procedure Laterality Date   COLONOSCOPY     FINGER SURGERY     right fifth finger secondary to football injury    VASECTOMY      Prior to Admission medications   Medication Sig Start Date End Date Taking? Authorizing Provider  atorvastatin (LIPITOR) 20 MG tablet Take 20 mg by mouth daily. Atorvastatin Calcium 20 MG Tablet 1 tablet Once a day   Yes [provider]  fluticasone (FLONASE) 50 MCG/ACT nasal spray Place 2 sprays into both nostrils daily. 04/25/20  Yes [provider]  Multiple Vitamins-Minerals (CENTRUM SILVER 50+MEN PO) Take by mouth.   Yes [provider]  valsartan-hydrochlorothiazide (DIOVAN-HCT) 160-25 MG per tablet Take 1 tablet by mouth daily. Valsartan-Hydrochlorothiazide 160-25 MG Tablet 1 tablet Once a day - Take 1/2 tab daily   Yes [provider]  Diclofenac Sodium (VOLTAREN PO) Take by mouth. Voltaren 75 MG tablet Tablet 1 tablet q 12 hrs with food, prn Patient not taking: Reported on 02/15/2021    [provider]  tadalafil (CIALIS) 5 MG tablet Take 5 mg by mouth daily as needed for erectile dysfunction. Patient not taking: Reported on 02/15/2021    [provider]    Current Outpatient Medications  Medication Sig Dispense Refill    atorvastatin (LIPITOR) 20 MG tablet Take 20 mg by mouth daily. Atorvastatin Calcium 20 MG Tablet 1 tablet Once a day     fluticasone (FLONASE) 50 MCG/ACT nasal spray Place 2 sprays into both nostrils daily.     Multiple Vitamins-Minerals (CENTRUM SILVER 50+MEN PO) Take by mouth.     valsartan-hydrochlorothiazide (DIOVAN-HCT) 160-25 MG per tablet Take 1 tablet by mouth daily. Valsartan-Hydrochlorothiazide 160-25 MG Tablet 1 tablet Once a day - Take 1/2 tab daily     Diclofenac Sodium (VOLTAREN PO) Take by mouth. Voltaren 75 MG tablet Tablet 1 tablet q 12 hrs with food, prn (Patient not taking: Reported on 02/15/2021)     tadalafil (CIALIS) 5 MG tablet Take 5 mg by mouth daily as needed for erectile dysfunction. (Patient not taking: Reported on 02/15/2021)     Current Facility-Administered Medications  Medication Dose Route Frequency Provider Last Rate Last Admin   0.9 %  sodium chloride infusion  500 mL Intravenous Once Ladene Artist, MD        Allergies as of 02/15/2021   (No Known Allergies)    Family History  Problem Relation Age of Onset   Dementia Mother    Heart disease Father    Stroke Father    Colon cancer Neg Hx    Rectal cancer Neg Hx    Stomach cancer Neg Hx    Colon polyps Neg Hx    Esophageal cancer Neg Hx  Social History   Socioeconomic History   Marital status: Married    Spouse name: Not on file   Number of children: Not on file   Years of education: Not on file   Highest education level: Not on file  Occupational History   Not on file  Tobacco Use   Smoking status: Never   Smokeless tobacco: Never  Vaping Use   Vaping Use: Never used  Substance and Sexual Activity   Alcohol use: Yes    Alcohol/week: 0.0 standard drinks    Comment: occasionally-rare   Drug use: No   Sexual activity: Not on file  Other Topics Concern   Not on file  Social History Narrative   Not on file   Social Determinants of Health   Financial Resource Strain: Not on  file  Food Insecurity: Not on file  Transportation Needs: Not on file  Physical Activity: Not on file  Stress: Not on file  Social Connections: Not on file  Intimate Partner Violence: Not on file    Review of Systems:  All systems reviewed an negative except where noted in HPI.  Gen: Denies any fever, chills, sweats, anorexia, fatigue, weakness, malaise, weight loss, and sleep disorder CV: Denies chest pain, angina, palpitations, syncope, orthopnea, PND, peripheral edema, and claudication. Resp: Denies dyspnea at rest, dyspnea with exercise, cough, sputum, wheezing, coughing up blood, and pleurisy. GI: Denies vomiting blood, jaundice, and fecal incontinence.   Denies dysphagia or odynophagia. GU : Denies urinary burning, blood in urine, urinary frequency, urinary hesitancy, nocturnal urination, and urinary incontinence. MS: Denies joint pain, limitation of movement, and swelling, stiffness, low back pain, extremity pain. Denies muscle weakness, cramps, atrophy.  Derm: Denies rash, itching, dry skin, hives, moles, warts, or unhealing ulcers.  Psych: Denies depression, anxiety, memory loss, suicidal ideation, hallucinations, paranoia, and confusion. Heme: Denies bruising, bleeding, and enlarged lymph nodes. Neuro:  Denies any headaches, dizziness, paresthesias. Endo:  Denies any problems with DM, thyroid, adrenal function.   Physical Exam: General:  Alert, well-developed, in NAD Head:  Normocephalic and atraumatic. Eyes:  Sclera clear, no icterus.   Conjunctiva pink. Ears:  Normal auditory acuity. Mouth:  No deformity or lesions.  Neck:  Supple; no masses . Lungs:  Clear throughout to auscultation.   No wheezes, crackles, or rhonchi. No acute distress. Heart:  Regular rate and rhythm; no murmurs. Abdomen:  Soft, nondistended, nontender. No masses, hepatomegaly. No obvious masses.  Normal bowel .    Rectal:  Deferred   Msk:  Symmetrical without gross deformities.. Pulses:  Normal  pulses noted. Extremities:  Without edema. Neurologic:  Alert and  oriented x4;  grossly normal neurologically. Skin:  Intact without significant lesions or rashes. Cervical Nodes:  No significant cervical adenopathy. Psych:  Alert and cooperative. Normal mood and affect.   Impression / Sosa:   Personal history of adenomatous colon polyps for surveillance colonoscopy.   Mitchell Sosa. Mitchell Sosa  02/15/2021, 11:29 AM See Shea Evans, [facility], to contact our on call provider

## 2021-02-15 NOTE — Progress Notes (Signed)
Called to room to assist during endoscopic procedure.  Patient ID and intended procedure confirmed with present staff. Received instructions for my participation in the procedure from the performing physician.  

## 2021-02-17 ENCOUNTER — Telehealth: Payer: Self-pay | Admitting: *Deleted

## 2021-02-17 ENCOUNTER — Telehealth: Payer: Self-pay

## 2021-02-17 NOTE — Telephone Encounter (Signed)
  Follow up Call-  Call back number 02/15/2021  Post procedure Call Back phone  # [phone]  Permission to leave phone message Yes  Some recent data might be hidden     Patient questions:  Do you have a fever, pain , or abdominal swelling? No. Pain Score  0 *  Have you tolerated food without any problems? Yes.    Have you been able to return to your normal activities? Yes.    Do you have any questions about your discharge instructions: Diet   No. Medications  No. Follow up visit  No.  Do you have questions or concerns about your Care? No.  Actions: * If pain score is 4 or above: No action needed, pain <4.  Have you developed a fever since your procedure? no  2.   Have you had an respiratory symptoms (SOB or cough) since your procedure? no  3.   Have you tested positive for COVID 19 since your procedure no  4.   Have you had any family members/close contacts diagnosed with the COVID 19 since your procedure?  no   If yes to any of these questions please route to Joylene John, RN and Joella Prince, RN

## 2021-02-17 NOTE — Telephone Encounter (Signed)
  Follow up Call-  Call back number 02/15/2021  Post procedure Call Back phone  # [phone]  Permission to leave phone message Yes  Some recent data might be hidden     Patient questions:  Do you have a fever, pain , or abdominal swelling? No. Pain Score  0 *  Have you tolerated food without any problems? Yes.    Have you been able to return to your normal activities? Yes.    Do you have any questions about your discharge instructions: Diet   No. Medications  No. Follow up visit  No.  Do you have questions or concerns about your Care? No.  Actions: * If pain score is 4 or above: No action needed, pain <4.

## 2021-03-06 ENCOUNTER — Encounter: Payer: Self-pay | Admitting: Gastroenterology

## 2021-06-27 ENCOUNTER — Other Ambulatory Visit: Payer: Self-pay | Admitting: Internal Medicine

## 2021-06-27 DIAGNOSIS — E785 Hyperlipidemia, unspecified: Secondary | ICD-10-CM

## 2021-07-26 ENCOUNTER — Ambulatory Visit
Admission: RE | Admit: 2021-07-26 | Discharge: 2021-07-26 | Disposition: A | Discharge status: Home / Self Care | Payer: No Typology Code available for payment source | Source: Ambulatory Visit | Attending: Internal Medicine | Admitting: Internal Medicine

## 2021-07-26 ENCOUNTER — Ambulatory Visit
Admission: RE | Admit: 2021-07-26 | Discharge: 2021-07-26 | Disposition: A | Discharge status: Home / Self Care | Payer: 59 | Source: Ambulatory Visit | Attending: Internal Medicine | Admitting: Internal Medicine

## 2021-07-26 ENCOUNTER — Other Ambulatory Visit: Payer: Self-pay | Admitting: Internal Medicine

## 2021-07-26 DIAGNOSIS — R1030 Lower abdominal pain, unspecified: Secondary | ICD-10-CM

## 2021-07-26 DIAGNOSIS — E785 Hyperlipidemia, unspecified: Secondary | ICD-10-CM

## 2022-04-02 ENCOUNTER — Encounter: Payer: Self-pay | Admitting: Gastroenterology

## 2022-04-24 ENCOUNTER — Ambulatory Visit: Payer: 59 | Admitting: Gastroenterology

## 2022-04-24 ENCOUNTER — Encounter: Payer: Self-pay | Admitting: Gastroenterology

## 2022-04-24 VITALS — BP 132/74 | HR 111 | Ht 73.0 in | Wt 314.0 lb

## 2022-04-24 DIAGNOSIS — R1013 Epigastric pain: Secondary | ICD-10-CM

## 2022-04-24 DIAGNOSIS — Z86010 Personal history of colonic polyps: Secondary | ICD-10-CM | POA: Diagnosis not present

## 2022-04-24 MED ORDER — PANTOPRAZOLE SODIUM 40 MG PO TBEC: 40.0000 mg | DELAYED_RELEASE_TABLET | Freq: Every day | ORAL | 11 refills | Status: AC

## 2022-04-24 NOTE — Patient Instructions (Signed)
We have sent the following medications to your pharmacy for you to pick up at your convenience: pantoprazole.   The [facility] providers would like to encourage you to use [facility] to communicate with providers for non-urgent requests or questions.  Due to long hold times on the telephone, sending your provider a message by [facility] may be a faster and more efficient way to get a response.  Please allow 48 business hours for a response.  Please remember that this is for non-urgent requests.   Thank you for choosing me and [facility].  Pricilla Riffle. Dagoberto Ligas., MD., [facility]

## 2022-04-24 NOTE — Progress Notes (Signed)
    Assessment     Epigastric / upper abdominal pain - R/O gastritis, duodenitis, ulcer Cholelithiasis, does not appear to be symptomatic.  History of diverticulitis, resolved Personal history of adenomatous colon polyps    Recommendations    Pantoprazole 40 mg qd. If not improved plan for EGD.  Patient advised to call in 2 weeks and report progress. High fiber diet, adequate water intake long term Surveillance colonoscopy recommended in Dec. 2025 REV in 1 month   HPI    This is a 62 year old male with epigastric/upper abdominal pain for the past 3 months.  He states it is present most all the time and is exacerbated by certain foods.  He describes an achy pain in a bandlike pattern across his upper abdomen.  He has not tried acid suppressants.  He has a history of diverticulitis in December 2022 and in June 2023.  Symptoms resolved with courses of antibiotics. Denies weight loss, constipation, diarrhea, change in stool caliber, melena, hematochezia, nausea, vomiting, dysphagia, reflux symptoms, chest pain.   Colonoscopy Dec 2022: moderate left sided diverticulosis, 4 sub-centimeter tubular adenomas, internal hemorrhoids  Labs / Imaging    CT AP 07/26/2021 1. Sigmoid diverticulitis, without abscess. Consider GI follow-up, to exclude mucosal lesion. 2. Possible cholelithiasis   Current Medications, Allergies, Past Medical History, Past Surgical History, Family History and Social History were reviewed in [employer] record.   Physical Exam: General: Well developed, well nourished, no acute distress Head: Normocephalic and atraumatic Eyes: Sclerae anicteric, EOMI Ears: Normal auditory acuity Mouth: No deformities or lesions noted Lungs: Clear throughout to auscultation Heart: Regular rate and rhythm; No murmurs, rubs or bruits Abdomen: Soft, non tender and non distended. No masses, hepatosplenomegaly or hernias noted. Normal Bowel sounds Rectal: not  done Musculoskeletal: Symmetrical with no gross deformities  Pulses:  Normal pulses noted Extremities: No edema or deformities noted Neurological: Alert oriented x 4, grossly nonfocal Psychological:  Alert and cooperative. Normal mood and affect   [name] T. Fuller Plan, MD 04/24/2022, 1:15 PM

## 2022-05-22 ENCOUNTER — Encounter: Payer: Self-pay | Admitting: Gastroenterology

## 2022-05-22 ENCOUNTER — Ambulatory Visit: Payer: 59 | Admitting: Gastroenterology

## 2022-05-22 VITALS — BP 130/74 | HR 76 | Ht 73.0 in | Wt 315.2 lb

## 2022-05-22 DIAGNOSIS — Z86010 Personal history of colonic polyps: Secondary | ICD-10-CM

## 2022-05-22 DIAGNOSIS — R1013 Epigastric pain: Secondary | ICD-10-CM | POA: Diagnosis not present

## 2022-05-22 NOTE — Progress Notes (Signed)
    Assessment     Epigastric / upper abdominal pain - R/O GERD, gastritis, duodenitis, ulcer and much less likely neoplasm Cholelithiasis, appears to be symptomatic.  History of diverticulitis Personal history of adenomatous colon polyps  BMI=41.59   Recommendations    Continue pantoprazole 40 mg qd.  Schedule EGD.  The risks (including bleeding, perforation, infection, missed lesions, medication reactions and possible hospitalization or surgery if complications occur), benefits, and alternatives to endoscopy with possible biopsy and possible dilation were discussed with the patient and they consent to proceed.   High fiber diet, adequate water intake long term Surveillance colonoscopy recommended in Dec. 2025 Discussed a long term weight loss plan   HPI    This is a 62 year old male with epigastric pain that has substantially improved on pantoprazole.  He relates 90% of his symptoms have been relieved.  He notes certain foods that exacerbate his symptoms.  He is concerned about the cause of his symptoms. Denies weight loss, constipation, diarrhea, change in stool caliber, melena, hematochezia, nausea, vomiting, dysphagia, chest pain.    Labs / Imaging        No data to display              No data to display          Current Medications, Allergies, Past Medical History, Past Surgical History, Family History and Social History were reviewed in [employer] record.   Physical Exam: General: Well developed, well nourished, no acute distress Head: Normocephalic and atraumatic Eyes: Sclerae anicteric, EOMI Ears: Normal auditory acuity Mouth: No deformities or lesions noted Lungs: Clear throughout to auscultation Heart: Regular rate and rhythm; No murmurs, rubs or bruits Abdomen: Soft, non tender and non distended. No masses, hepatosplenomegaly or hernias noted. Normal Bowel sounds Rectal: Not done Musculoskeletal: Symmetrical with no gross  deformities  Pulses:  Normal pulses noted Extremities: No edema or deformities noted Neurological: Alert oriented x 4, grossly nonfocal Psychological:  Alert and cooperative. Normal mood and affect   Koral Thaden T. Fuller Plan, MD 05/22/2022, 1:57 PM

## 2022-05-22 NOTE — Patient Instructions (Signed)
You have been scheduled for an endoscopy. Please follow written instructions given to you at your visit today. °If you use inhalers (even only as needed), please bring them with you on the day of your procedure. ° °The [facility] providers would like to encourage you to use MYCH[facility] to communicate with providers for non-urgent requests or questions.  Due to long hold times on the telephone, sending your provider a message by MYCHART may be a faster and more efficient way to get a response.  Please allow 48 business hours for a response.  Please remember that this is for non-urgent requests.  ° °Due to recent changes in healthcare laws, you may see the results of your imaging and laboratory studies on MyChart before your provider has had a chance to review them.  We understand that in some cases there may be results that are confusing or concerning to you. Not all laboratory results come back in the same time frame and the provider may be waiting for multiple results in order to interpret others.  Please give us 48 hours in order for your provider to thoroughly review all the results before contacting the office for clarification of your results.  ° °Thank you for choosing me and [facility]. ° °Malcolm T. Stark, Jr., MD., FACG ° °

## 2022-05-23 ENCOUNTER — Ambulatory Visit (AMBULATORY_SURGERY_CENTER): Payer: 59 | Admitting: Gastroenterology

## 2022-05-23 ENCOUNTER — Encounter: Payer: Self-pay | Admitting: Gastroenterology

## 2022-05-23 VITALS — BP 124/83 | HR 62 | Temp 97.1°F | Resp 15 | Ht 73.0 in | Wt 315.0 lb

## 2022-05-23 DIAGNOSIS — R1013 Epigastric pain: Secondary | ICD-10-CM

## 2022-05-23 DIAGNOSIS — K2970 Gastritis, unspecified, without bleeding: Secondary | ICD-10-CM | POA: Diagnosis present

## 2022-05-23 MED ORDER — SODIUM CHLORIDE 0.9 % IV SOLN: 500.0000 mL | Freq: Once | INTRAVENOUS | Status: DC

## 2022-05-23 NOTE — Op Note (Signed)
[name] Procedure Date: 05/23/2022 2:37 PM MRN: [id number] Endoscopist: Ladene Artist , MD, [id number] Age: 62 Referring MD:  Date of Birth: September 29, 1960 Gender: Male Account #: [id number] Procedure:                Upper GI endoscopy Indications:              Epigastric abdominal pain Medicines:                Monitored Anesthesia Care Procedure:                Pre-Anesthesia Assessment:                           - Prior to the procedure, a History and Physical                            was performed, and patient medications and                            allergies were reviewed. The patient's tolerance of                            previous anesthesia was also reviewed. The risks                            and benefits of the procedure and the sedation                            options and risks were discussed with the patient.                            All questions were answered, and informed consent                            was obtained. Prior Anticoagulants: The patient has                            taken no anticoagulant or antiplatelet agents. ASA                            Grade Assessment: III - A patient with severe                            systemic disease. After reviewing the risks and                            benefits, the patient was deemed in satisfactory                            condition to undergo the procedure.                           After obtaining informed consent, the endoscope was  passed under direct vision. Throughout the                            procedure, the patient's blood pressure, pulse, and                            oxygen saturations were monitored continuously. The                            Endoscope Olympus [id number] was introduced through                            the mouth, and advanced to the second part of                            duodenum. The upper GI  endoscopy was accomplished                            without difficulty. The patient tolerated the                            procedure well. Scope In: Scope Out: Findings:                 The examined esophagus was normal.                           Diffuse moderate inflammation characterized by                            erythema, friability and granularity was found in                            the gastric fundus and in the gastric body.                            Biopsies were taken with a cold forceps for                            histology.                           The exam of the stomach was otherwise normal.                           The duodenal bulb and second portion of the                            duodenum were normal. Complications:            No immediate complications. Estimated Blood Loss:     Estimated blood loss was minimal. Impression:               - Normal esophagus.                           - Gastritis. Biopsied.                           -  Normal duodenal bulb and second portion of the                            duodenum. Recommendation:           - Patient has a contact number available for                            emergencies. The signs and symptoms of potential                            delayed complications were discussed with the                            patient. Return to normal activities tomorrow.                            Written discharge instructions were provided to the                            patient.                           - Resume previous diet.                           - Continue present medications.                           - Await pathology results. Ladene Artist, MD 05/23/2022 2:55:53 PM This report has been signed electronically.

## 2022-05-23 NOTE — Progress Notes (Signed)
Called to room to assist during endoscopic procedure.  Patient ID and intended procedure confirmed with present staff. Received instructions for my participation in the procedure from the performing physician.  

## 2022-05-23 NOTE — Patient Instructions (Signed)
-  Handout on Gastritis  provided -await pathology results -Continue present medications   YOU HAD AN ENDOSCOPIC PROCEDURE TODAY AT [place]:   Refer to the procedure report that was given to you for any specific questions about what was found during the examination.  If the procedure report does not answer your questions, please call your gastroenterologist to clarify.  If you requested that your care partner not be given the details of your procedure findings, then the procedure report has been included in a sealed envelope for you to review at your convenience later.  YOU SHOULD EXPECT: Some feelings of bloating in the abdomen. Passage of more gas than usual.  Walking can help get rid of the air that was put into your GI tract during the procedure and reduce the bloating. If you had a lower endoscopy (such as a colonoscopy or flexible sigmoidoscopy) you may notice spotting of blood in your stool or on the toilet paper. If you underwent a bowel prep for your procedure, you may not have a normal bowel movement for a few days.  Please Note:  You might notice some irritation and congestion in your nose or some drainage.  This is from the oxygen used during your procedure.  There is no need for concern and it should clear up in a day or so.  SYMPTOMS TO REPORT IMMEDIATELY:  Following upper endoscopy (EGD)  Vomiting of blood or coffee ground material  New chest pain or pain under the shoulder blades  Painful or persistently difficult swallowing  New shortness of breath  Fever of 100F or higher  Black, tarry-looking stools  For urgent or emergent issues, a gastroenterologist can be reached at any hour by calling [phone]. Do not use MyChart messaging for urgent concerns.    DIET:  We do recommend a small meal at first, but then you may proceed to your regular diet.  Drink plenty of fluids but you should avoid alcoholic beverages for 24 hours.  ACTIVITY:  You should plan  to take it easy for the rest of today and you should NOT DRIVE or use heavy machinery until tomorrow (because of the sedation medicines used during the test).    FOLLOW UP: Our staff will call the number listed on your records the next business day following your procedure.  We will call around 7:15- 8:00 am to check on you and address any questions or concerns that you may have regarding the information given to you following your procedure. If we do not reach you, we will leave a message.     If any biopsies were taken you will be contacted by phone or by letter within the next 1-3 weeks.  Please call us at [phone] if you have not heard about the biopsies in 3 weeks.    SIGNATURES/CONFIDENTIALITY: You and/or your care partner have signed paperwork which will be entered into your electronic medical record.  These signatures attest to the fact that that the information above on your After Visit Summary has been reviewed and is understood.  Full responsibility of the confidentiality of this discharge information lies with you and/or your care-partner.

## 2022-05-23 NOTE — Progress Notes (Signed)
Sedate, gd SR's, VSS, report to RN 

## 2022-05-23 NOTE — Progress Notes (Signed)
See 05/22/2022 H&P, no changes

## 2022-05-23 NOTE — Progress Notes (Signed)
VS by [facility]  Pt's states no medical or surgical changes since previsit or office visit.

## 2022-05-24 ENCOUNTER — Telehealth: Payer: Self-pay | Admitting: *Deleted

## 2022-05-24 NOTE — Telephone Encounter (Signed)
  Follow up Call-     05/23/2022    1:49 PM 02/15/2021   10:48 AM  Call back number  Post procedure Call Back phone  # [phone]  Permission to leave phone message Yes Yes     Patient questions:  Do you have a fever, pain , or abdominal swelling? No. Pain Score  0 *  Have you tolerated food without any problems? Yes.    Have you been able to return to your normal activities? Yes.    Do you have any questions about your discharge instructions: Diet   No. Medications  No. Follow up visit  No.  Do you have questions or concerns about your Care? No.  Actions: * If pain score is 4 or above: No action needed, pain <4.

## 2022-05-30 ENCOUNTER — Encounter: Payer: Self-pay | Admitting: Gastroenterology

## 2023-01-08 NOTE — Progress Notes (Unsigned)
  Electrophysiology Office Note:   Date:  01/09/2023  ID:  Mitchell Sosa, DOB 1961/01/13, MRN [id number]  Primary Cardiologist: None Electrophysiologist: Nobie Putnam, MD      History of Present Illness:   Mitchell Sosa is a 62 y.o. male with h/o hypertension, eczema, diverticulitis, sleep apnea who is seen today for evaluation of PVCs at the request of Dr. Shelly Flatten.  Patient had annual physical with his PCP and was found to have an irregular heartbeat.  EKG was performed which showed frequent unifocal PVCs.  Patient reports that if not for his PCP visit he would be completely unaware of the presence of PVCs.  He denies any history of palpitations, fluttering in his chest, dizziness lightheadedness syncope.  He is otherwise well with no acute complaints.  Review of systems complete and found to be negative unless listed in HPI.   EP Information / Studies Reviewed:    EKG is ordered today. Personal review as below.   EKG Interpretation Date/Time:  Wednesday January 09 2023 09:55:25 EDT Ventricular Rate:  69 PR Interval:  174 QRS Duration:  106 QT Interval:  418 QTC Calculation: 447 R Axis:   -17  Text Interpretation: Sinus rhythm with frequent Premature ventricular complexes Nonspecific ST abnormality No previous ECGs available Confirmed by Nobie Putnam [phone]) on 01/09/2023 10:01:42 AM   EKG 12/10/22:    Physical Exam:   VS:  BP 112/78   Pulse 61   Ht 6\' 1"  (1.854 m)   Wt (!) 301 lb (136.5 kg)   SpO2 94%   BMI 39.71 kg/m    Wt Readings from Last 3 Encounters:  01/09/23 (!) 301 lb (136.5 kg)  05/23/22 (!) 315 lb (142.9 kg)  05/22/22 (!) 315 lb 3.2 oz (143 kg)     GEN: Well nourished, well developed in no acute distress NECK: No JVD; No carotid bruits CARDIAC: Normal rate, irregular rhythm. RESPIRATORY:  Clear to auscultation without rales, wheezing or rhonchi  ABDOMEN: Soft, non-tender, non-distended EXTREMITIES:  No edema; No deformity   ASSESSMENT  AND PLAN:   Mitchell Sosa is a 62 y.o. male with h/o hypertension, eczema, diverticulitis, sleep apnea who is seen today for evaluation of PVCs at the request of Dr. Shelly Flatten.  #PVCs: Posteromedial pap muscle origin.  Appears to be asymptomatic.  Burden on EKG today is 25%. - Echocardiogram ordered.  - Zio patch to evaluate PVC burden.  - He is asymptomatic we will avoid medications at this time.  If his Zio monitor shows very high burden over long period of time or echocardiogram shows LVEF reduced, then we will discuss medication options to suppress PVCs or even catheter ablation.  Follow up with Dr. Jimmey Ralph  in 6-8 weeks.   Signed, Nobie Putnam, MD

## 2023-01-09 ENCOUNTER — Encounter: Payer: Self-pay | Admitting: Cardiology

## 2023-01-09 ENCOUNTER — Ambulatory Visit: Payer: 59 | Attending: Cardiology | Admitting: Cardiology

## 2023-01-09 ENCOUNTER — Ambulatory Visit (INDEPENDENT_AMBULATORY_CARE_PROVIDER_SITE_OTHER): Payer: 59

## 2023-01-09 VITALS — BP 112/78 | HR 61 | Ht 73.0 in | Wt 301.0 lb

## 2023-01-09 DIAGNOSIS — I493 Ventricular premature depolarization: Secondary | ICD-10-CM | POA: Diagnosis not present

## 2023-01-09 NOTE — Progress Notes (Unsigned)
Enrolled for [employer] to mail a ZIO XT long term holter monitor to the patients address on file.  

## 2023-01-09 NOTE — Patient Instructions (Signed)
Medication Instructions:  Your physician recommends that you continue on your current medications as directed. Please refer to the Current Medication list given to you today. *If you need a refill on your cardiac medications before your next appointment, please call your pharmacy*   Testing/Procedures: Echocardiogram Your physician has requested that you have an echocardiogram. Echocardiography is a painless test that uses sound waves to create images of your heart. It provides your doctor with information about the size and shape of your heart and how well your heart's chambers and valves are working. This procedure takes approximately one hour. There are no restrictions for this procedure. Please do NOT wear cologne, perfume, aftershave, or lotions (deodorant is allowed). Please arrive 15 minutes prior to your appointment time.  Zio Cardiac Monitor for 7 days Your physician has recommended that you wear an event monitor. Event monitors are medical devices that record the heart's electrical activity. Doctors most often [place] these monitors to diagnose arrhythmias. Arrhythmias are problems with the speed or rhythm of the heartbeat. The monitor is a small, portable device. You can wear one while you do your normal daily activities. This is usually used to diagnose what is causing palpitations/syncope (passing out).   Follow-Up: At [facility], you and your health needs are our priority.  As part of our continuing mission to provide you with exceptional heart care, we have created designated Provider Care Teams.  These Care Teams include your primary Cardiologist (physician) and Advanced Practice Providers (APPs -  Physician Assistants and Nurse Practitioners) who all work together to provide you with the care you need, when you need it.  We recommend signing up for the patient portal called "MyChart".  Sign up information is provided on this After Visit Summary.  MyChart is used to connect  with patients for Virtual Visits (Telemedicine).  Patients are able to view lab/test results, encounter notes, upcoming appointments, etc.  Non-urgent messages can be sent to your provider as well.   To learn more about what you can do with MyChart, go to [email or web address].    Your next appointment:   6-8 week(s) - schedule after echocardiogram please  Provider:   Nobie Putnam, MD    Christena Deem- Long Term Monitor Instructions  Your physician has requested you wear a ZIO patch monitor for 7 days.  This is a single patch monitor. [employer] supplies one patch monitor per enrollment. Additional stickers are not available. Please do not apply patch if you will be having a Nuclear Stress Test,  Echocardiogram, Cardiac CT, MRI, or Chest Xray during the period you would be wearing the  monitor. The patch cannot be worn during these tests. You cannot remove and re-apply the  ZIO XT patch monitor.  Your ZIO patch monitor will be mailed 3 day USPS to your address on file. It may take 3-5 days  to receive your monitor after you have been enrolled.  Once you have received your monitor, please review the enclosed instructions. Your monitor  has already been registered assigning a specific monitor serial # to you.  Billing and Patient Assistance Program Information  We have supplied [employer] with any of your insurance information on file for billing purposes. [employer] offers a sliding scale Patient Assistance Program for patients that do not have  insurance, or whose insurance does not completely cover the cost of the ZIO monitor.  You must apply for the Patient Assistance Program to qualify for this discounted rate.  To apply, please call [employer]  at [phone], select option 4, select option 2, ask to apply for  Patient Assistance Program. Meredeth Ide will ask your household income, and how many people  are in your household. They will quote your out-of-pocket cost based on that information.  [employer]  will also be able to set up a 45-month, interest-free payment plan if needed.  Applying the monitor   Shave hair from upper left chest.  Hold abrader disc by orange tab. Rub abrader in 40 strokes over the upper left chest as  indicated in your monitor instructions.  Clean area with 4 enclosed alcohol pads. Let dry.  Apply patch as indicated in monitor instructions. Patch will be placed under collarbone on left  side of chest with arrow pointing upward.  Rub patch adhesive wings for 2 minutes. Remove white label marked "1". Remove the white  label marked "2". Rub patch adhesive wings for 2 additional minutes.  While looking in a mirror, press and release button in center of patch. A small green light will  flash 3-4 times. This will be your only indicator that the monitor has been turned on.  Do not shower for the first 24 hours. You may shower after the first 24 hours.  Press the button if you feel a symptom. You will hear a small click. Record Date, Time and  Symptom in the Patient Logbook.  When you are ready to remove the patch, follow instructions on the last 2 pages of Patient  Logbook. Stick patch monitor onto the last page of Patient Logbook.  Place Patient Logbook in the blue and white box. Use locking tab on box and tape box closed  securely. The blue and white box has prepaid postage on it. Please place it in the mailbox as  soon as possible. Your physician should have your test results approximately 7 days after the  monitor has been mailed back to [facility].  Call [facility] Customer Care at [phone] if you have questions regarding  your ZIO XT patch monitor. Call them immediately if you see an orange light blinking on your  monitor.  If your monitor falls off in less than 4 days, contact our Monitor department at [phone].  If your monitor becomes loose or falls off after 4 days call [employer] at [phone] for  suggestions on securing your monitor

## 2023-01-20 DIAGNOSIS — I493 Ventricular premature depolarization: Secondary | ICD-10-CM | POA: Diagnosis not present

## 2023-01-24 ENCOUNTER — Institutional Professional Consult (permissible substitution): Payer: 59 | Admitting: Cardiology

## 2023-02-12 ENCOUNTER — Ambulatory Visit (HOSPITAL_COMMUNITY): Payer: 59 | Attending: Cardiology

## 2023-02-12 DIAGNOSIS — I493 Ventricular premature depolarization: Secondary | ICD-10-CM | POA: Diagnosis present

## 2023-02-12 LAB — ECHOCARDIOGRAM COMPLETE
Area-P 1/2: 3.27 cm2
Calc EF: 61.1 %
S' Lateral: 2.9 cm
Single Plane A2C EF: 61.4 %
Single Plane A4C EF: 59.5 %

## 2023-02-19 ENCOUNTER — Encounter: Payer: Self-pay | Admitting: Cardiology

## 2023-02-19 ENCOUNTER — Ambulatory Visit: Payer: 59 | Attending: Cardiology | Admitting: Cardiology

## 2023-02-19 VITALS — BP 130/84 | HR 56 | Ht 73.0 in | Wt 309.0 lb

## 2023-02-19 DIAGNOSIS — I493 Ventricular premature depolarization: Secondary | ICD-10-CM | POA: Diagnosis not present

## 2023-02-19 MED ORDER — METOPROLOL SUCCINATE ER 25 MG PO TB24: 25.0000 mg | ORAL_TABLET | Freq: Every day | ORAL | 3 refills | Status: DC

## 2023-02-19 NOTE — Progress Notes (Signed)
  Electrophysiology Office Note:   Date:  02/19/2023  ID:  Mitchell Sosa, DOB Mar 26, 1960, MRN [id number]  Primary Cardiologist: None Electrophysiologist: Nobie Putnam, MD      History of Present Illness:   Mitchell Sosa is a 62 y.o. male with h/o hypertension, eczema, diverticulitis, sleep apnea who is being seen today for routine electrophysiology followup for PVCs.   Since last being seen in our clinic on 01/09/23, the patient reports doing relatively well.  He has been very active of late, working on his farm, doing things such as chopping wood.  He reports no difficulties with this.  He denies chest pain, palpitations, dyspnea, PND, orthopnea, nausea, vomiting, dizziness, syncope, edema, weight gain, or early satiety.   Review of systems complete and found to be negative unless listed in HPI.   EP Information / Studies Reviewed:    EKG is not ordered today. EKG from 01/09/23 reviewed which showed sinus rhythm with PVCs      Zio Monitor 01/2023:  PVC burden 16%, some NSVT with longest lasting 4 beats  Echo 02/12/23:  Normal LV size and function.  LVEF 60 to 65%. Normal RV function.  RV is mildly enlarged. No significant valvular disease. Left and right atrium are normal in size.   Physical Exam:   VS:  BP 130/84 (BP Location: Left Arm, Patient Position: Sitting, Cuff Size: Large)   Pulse (!) 56   Ht 6\' 1"  (1.854 m)   Wt (!) 309 lb (140.2 kg)   SpO2 97%   BMI 40.77 kg/m    Wt Readings from Last 3 Encounters:  02/19/23 (!) 309 lb (140.2 kg)  01/09/23 (!) 301 lb (136.5 kg)  05/23/22 (!) 315 lb (142.9 kg)     GEN: Well nourished, well developed in no acute distress NECK: No JVD CARDIAC: Normal rate and irregular rhythm. RESPIRATORY:  Clear to auscultation without rales, wheezing or rhonchi  ABDOMEN: Soft, non-tender, non-distended EXTREMITIES:  No edema; No deformity   ASSESSMENT AND PLAN:   Mitchell Sosa is a 62 y.o. male with h/o hypertension, eczema,  diverticulitis, sleep apnea who is seen today for evaluation of PVCs at the request of Dr. Shelly Flatten.   #PVCs: Posteromedial pap muscle origin.  Appears to be asymptomatic.  Burden on EKG today is 25%. - Echocardiogram with normal LVEF. - Zio patch with PVC burden of 16%. -Given that he is asymptomatic with a normal LVEF, we have opted for a more conservative strategy at this time.  However, with a PVC burden above 15%, he will likely does carry a increased risk of developing PVC induced cardiomyopathy so we discussed starting low-dose metoprolol in efforts to help with some degree of PVC suppression and he is in agreement with this.  If he were to develop symptoms or decline in his LVEF then antiarrhythmic drug therapy or catheter ablation would be appropriate at that time.  Follow up with Dr. Jimmey Ralph in 6 months.  Total time of encounter: 60 minutes total time of encounter, including chart review, face-to-face patient care, coordination of care and counseling regarding high complexity medical decision making.  Signed, Nobie Putnam, MD

## 2023-02-19 NOTE — Patient Instructions (Signed)
Medication Instructions:  Your physician has recommended you make the following change in your medication:  1) START taking Toprol XL (metoprolol succinate) 25 mg daily   *If you need a refill on your cardiac medications before your next appointment, please call your pharmacy*  Follow-Up: At [facility], you and your health needs are our priority.  As part of our continuing mission to provide you with exceptional heart care, we have created designated Provider Care Teams.  These Care Teams include your primary Cardiologist (physician) and Advanced Practice Providers (APPs -  Physician Assistants and Nurse Practitioners) who all work together to provide you with the care you need, when you need it.  Your next appointment:   6 month  Provider:   You may see Nobie Putnam, MD or one of the following Advanced Practice Providers on your designated Care Team:   Francis Dowse, [place] [address] [place], [place] Sherie Don, NP Canary Brim, NP

## 2023-04-22 ENCOUNTER — Encounter: Payer: Self-pay | Admitting: Cardiology

## 2023-08-21 NOTE — Progress Notes (Signed)
 Electrophysiology Office Note:   Date:  08/23/2023  ID:  Mitchell Sosa, DOB [birth year], MRN [id number]  Primary Cardiologist: None Electrophysiologist: Ardeen Kohler, MD      History of Present Illness:   Mitchell Sosa is a 63 y.o. male with h/o hypertension, eczema, diverticulitis, sleep apnea who is being seen today for routine electrophysiology followup for PVCs.   Discussed the use of AI scribe software for clinical note transcription with the patient, who gave verbal consent to proceed.  He has been experiencing frequent premature ventricular contractions (PVCs) originating from the bottom chamber of the heart. He has been on metoprolol  for the past six months, taken at night, and reports no adverse effects or side effects such as dizziness. He is unsure if the medication has reduced the frequency of PVCs, but he feels well overall.  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 He notes that he gets tired, which he attributes to aging, but finds it helps him sleep well.  He does not experience persistent leg swelling or shortness of breath with activity or when lying down. He sleeps on a slightly elevated bed but does not require a recliner to sleep comfortably. 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  No dizziness, shortness of breath, persistent leg swelling, or any change in exercise tolerance.  Review of systems complete and found to be negative unless listed in HPI.   EP Information / Studies Reviewed:    EKG is ordered today. Personal review as below. EKG Interpretation Date/Time:  Thursday August 22 2023 10:58:49 EDT Ventricular Rate:  69 PR Interval:  174 QRS Duration:  100 QT Interval:  402 QTC  Calculation: 430 R Axis:   -18  Text Interpretation: Sinus rhythm with frequent Premature ventricular complexes in a pattern of bigeminy Nonspecific ST and T wave abnormality When compared with ECG of 09-Jan-2023 09:55,now in bigeminy. Confirmed by Ardeen Kohler [phone]) on 08/22/2023 11:16:14 AM    Zio Monitor 01/2023:  PVC burden 16%, some NSVT with longest lasting 4 beats  Echo 02/12/23:  Normal LV size and function.  LVEF 60 to 65%. Normal RV function.  RV is mildly enlarged. No significant valvular disease. Left and right atrium are normal in size.   Physical Exam:   VS:  BP 118/67   Pulse 69   Ht 6' 1 (1.854 m)   Wt 300 lb (136.1 kg)   SpO2 92%   BMI 39.58 kg/m    Wt Readings from Last 3 Encounters:  08/22/23 300 lb (136.1 kg)  02/19/23 (!) 309 lb (140.2 kg)  01/09/23 (!) 301 lb (136.5 kg)     GEN: Well nourished, well developed in no acute distress NECK: No JVD CARDIAC: Normal rate and regular rhythm. RESPIRATORY:  Clear to auscultation without rales, wheezing or rhonchi  ABDOMEN: Soft, non-tender, non-distended EXTREMITIES:  No edema; No deformity   ASSESSMENT AND PLAN:     #PVCs: Posteromedial pap muscle origin.  Appears to be asymptomatic.   - Last echocardiogram 02/2023 with normal LVEF. - Zio patch with PVC burden of 16%.  Pattern of bigeminy on today's EKG. -Given that he is asymptomatic with a normal LVEF, we have opted for a more conservative strategy at this time.  He will continue metoprolol  XL 25 mg once daily, which he is tolerating without side effects.  However, with a PVC burden above 15%, he will likely does carry a increased risk of developing PVC induced cardiomyopathy so we discussed annual surveillance monitoring of his LVEF, next in 6 months. If he were to develop symptoms or decline in his LVEF then antiarrhythmic drug therapy or catheter ablation would be appropriate at that time.  #Hypertension -At goal today.  Recommend checking blood  pressures 1-2 times per week at home and recording the values.  Recommend bringing these recordings to the primary care physician.  Follow up with Dr. Daneil Dunker in 6 months.  Signed, Ardeen Kohler, MD

## 2023-08-22 ENCOUNTER — Ambulatory Visit: Payer: 59 | Attending: Cardiology | Admitting: Cardiology

## 2023-08-22 ENCOUNTER — Encounter: Payer: Self-pay | Admitting: Cardiology

## 2023-08-22 VITALS — BP 118/67 | HR 69 | Ht 73.0 in | Wt 300.0 lb

## 2023-08-22 DIAGNOSIS — I10 Essential (primary) hypertension: Secondary | ICD-10-CM | POA: Diagnosis not present

## 2023-08-22 DIAGNOSIS — I493 Ventricular premature depolarization: Secondary | ICD-10-CM | POA: Diagnosis not present

## 2023-08-22 NOTE — Patient Instructions (Signed)
 Medication Instructions:  Your physician recommends that you continue on your current medications as directed. Please refer to the Current Medication list given to you today.  *If you need a refill on your cardiac medications before your next appointment, please call your pharmacy*  Testing/Procedures: Echocardiogram - in 6 months Your physician has requested that you have an echocardiogram. Echocardiography is a painless test that uses sound waves to create images of your heart. It provides your doctor with information about the size and shape of your heart and how well your heart's chambers and valves are working. This procedure takes approximately one hour. There are no restrictions for this procedure. Please do NOT wear cologne, perfume, aftershave, or lotions (deodorant is allowed). Please arrive 15 minutes prior to your appointment time.  Please note: We ask at that you not bring children with you during ultrasound (echo/ vascular) testing. Due to room size and safety concerns, children are not allowed in the ultrasound rooms during exams. Our front office staff cannot provide observation of children in our lobby area while testing is being conducted. An adult accompanying a patient to their appointment will only be allowed in the ultrasound room at the discretion of the ultrasound technician under special circumstances. We apologize for any inconvenience.  Follow-Up: At [facility], you and your health needs are our priority.  As part of our continuing mission to provide you with exceptional heart care, our providers are all part of one team.  This team includes your primary Cardiologist (physician) and Advanced Practice Providers or APPs (Physician Assistants and Nurse Practitioners) who all work together to provide you with the care you need, when you need it.  Your next appointment:   6 months  Provider:   Ardeen Kohler, MD

## 2024-02-14 ENCOUNTER — Telehealth: Payer: Self-pay | Admitting: Cardiology

## 2024-02-14 MED ORDER — METOPROLOL SUCCINATE ER 25 MG PO TB24: 25.0000 mg | ORAL_TABLET | Freq: Every day | ORAL | 0 refills | Status: AC

## 2024-02-14 NOTE — Telephone Encounter (Signed)
*  STAT* If patient is at the pharmacy, call can be transferred to refill team.   1. Which medications need to be refilled? (please list name of each medication and dose if known) metoprolol  succinate (TOPROL  XL) 25 MG 24 hr tablet    2. Would you like to learn more about the convenience, safety, & potential cost savings by using the [facility]?    3. Are you open to using the [facility] (Type [facility].  ).   4. Which pharmacy/location (including street and city if local pharmacy) is medication to be sent to? [address] [facility] - [place], [place] - [address]    5. Do they need a 30 day or 90 day supply? 90 day

## 2024-02-14 NOTE — Telephone Encounter (Signed)
 Refill sent.  Pt aware to call and schedule appointment.

## 2024-02-18 ENCOUNTER — Ambulatory Visit (HOSPITAL_COMMUNITY): Admission: RE | Admit: 2024-02-18 | Discharge: 2024-02-18 | Attending: Cardiology | Admitting: Cardiology

## 2024-02-18 DIAGNOSIS — I493 Ventricular premature depolarization: Secondary | ICD-10-CM

## 2024-02-18 DIAGNOSIS — I10 Essential (primary) hypertension: Secondary | ICD-10-CM

## 2024-02-18 LAB — ECHOCARDIOGRAM COMPLETE
Area-P 1/2: 7.44 cm2
S' Lateral: 3.65 cm

## 2024-03-01 ENCOUNTER — Ambulatory Visit: Payer: Self-pay | Admitting: Cardiology

## 2024-03-01 DIAGNOSIS — I10 Essential (primary) hypertension: Secondary | ICD-10-CM

## 2024-03-01 DIAGNOSIS — I493 Ventricular premature depolarization: Secondary | ICD-10-CM

## 2024-03-01 DIAGNOSIS — Z79899 Other long term (current) drug therapy: Secondary | ICD-10-CM
